# Patient Record
Sex: Female | Born: 1955 | Race: Black or African American | Hispanic: No | Marital: Married | Smoking: Never smoker
Health system: Southern US, Community
[De-identification: ages and names within clinical notes are randomized; demographics above are authoritative.]

## PROBLEM LIST (undated history)

## (undated) DIAGNOSIS — M810 Age-related osteoporosis without current pathological fracture: Secondary | ICD-10-CM

## (undated) DIAGNOSIS — L659 Nonscarring hair loss, unspecified: Secondary | ICD-10-CM

## (undated) DIAGNOSIS — C801 Malignant (primary) neoplasm, unspecified: Secondary | ICD-10-CM

## (undated) DIAGNOSIS — C50919 Malignant neoplasm of unspecified site of unspecified female breast: Secondary | ICD-10-CM

## (undated) DIAGNOSIS — M47814 Spondylosis without myelopathy or radiculopathy, thoracic region: Secondary | ICD-10-CM

## (undated) DIAGNOSIS — E785 Hyperlipidemia, unspecified: Secondary | ICD-10-CM

## (undated) DIAGNOSIS — L309 Dermatitis, unspecified: Secondary | ICD-10-CM

## (undated) DIAGNOSIS — T8859XA Other complications of anesthesia, initial encounter: Secondary | ICD-10-CM

## (undated) DIAGNOSIS — I10 Essential (primary) hypertension: Secondary | ICD-10-CM

## (undated) DIAGNOSIS — E669 Obesity, unspecified: Secondary | ICD-10-CM

## (undated) DIAGNOSIS — N649 Disorder of breast, unspecified: Secondary | ICD-10-CM

## (undated) DIAGNOSIS — T4145XA Adverse effect of unspecified anesthetic, initial encounter: Secondary | ICD-10-CM

## (undated) DIAGNOSIS — L409 Psoriasis, unspecified: Secondary | ICD-10-CM

## (undated) DIAGNOSIS — L219 Seborrheic dermatitis, unspecified: Secondary | ICD-10-CM

## (undated) DIAGNOSIS — Z853 Personal history of malignant neoplasm of breast: Secondary | ICD-10-CM

## (undated) DIAGNOSIS — K635 Polyp of colon: Secondary | ICD-10-CM

## (undated) HISTORY — DX: Polyp of colon: K63.5

## (undated) HISTORY — DX: Hyperlipidemia, unspecified: E78.5

## (undated) HISTORY — DX: Essential (primary) hypertension: I10

## (undated) HISTORY — DX: Malignant (primary) neoplasm, unspecified: C80.1

## (undated) HISTORY — DX: Obesity, unspecified: E66.9

## (undated) HISTORY — PX: TONSILLECTOMY: SUR1361

## (undated) HISTORY — PX: SHOULDER SURGERY: SHX246

## (undated) HISTORY — DX: Personal history of malignant neoplasm of breast: Z85.3

---

## 1996-12-02 HISTORY — PX: MASTECTOMY: SHX3

## 1998-01-27 ENCOUNTER — Ambulatory Visit (HOSPITAL_COMMUNITY): Admission: RE | Admit: 1998-01-27 | Discharge: 1998-01-27 | Payer: Self-pay | Admitting: Family Medicine

## 1998-04-21 ENCOUNTER — Encounter: Admission: RE | Admit: 1998-04-21 | Discharge: 1998-07-20 | Payer: Self-pay | Admitting: Family Medicine

## 1998-06-06 ENCOUNTER — Ambulatory Visit (HOSPITAL_BASED_OUTPATIENT_CLINIC_OR_DEPARTMENT_OTHER): Admission: RE | Admit: 1998-06-06 | Discharge: 1998-06-06 | Payer: Self-pay | Admitting: *Deleted

## 1998-08-08 ENCOUNTER — Emergency Department (HOSPITAL_COMMUNITY): Admission: EM | Admit: 1998-08-08 | Discharge: 1998-08-08 | Payer: Self-pay | Admitting: Emergency Medicine

## 1998-12-02 DIAGNOSIS — Z853 Personal history of malignant neoplasm of breast: Secondary | ICD-10-CM

## 1998-12-02 HISTORY — DX: Personal history of malignant neoplasm of breast: Z85.3

## 1999-03-22 ENCOUNTER — Ambulatory Visit (HOSPITAL_COMMUNITY): Admission: RE | Admit: 1999-03-22 | Discharge: 1999-03-22 | Payer: Self-pay | Admitting: Obstetrics and Gynecology

## 1999-03-22 ENCOUNTER — Encounter: Payer: Self-pay | Admitting: Obstetrics and Gynecology

## 2000-03-06 ENCOUNTER — Encounter: Payer: Self-pay | Admitting: Obstetrics and Gynecology

## 2000-03-06 ENCOUNTER — Ambulatory Visit (HOSPITAL_COMMUNITY): Admission: RE | Admit: 2000-03-06 | Discharge: 2000-03-06 | Payer: Self-pay | Admitting: Obstetrics and Gynecology

## 2000-04-29 ENCOUNTER — Other Ambulatory Visit: Admission: RE | Admit: 2000-04-29 | Discharge: 2000-04-29 | Payer: Self-pay | Admitting: Obstetrics and Gynecology

## 2001-03-09 ENCOUNTER — Ambulatory Visit (HOSPITAL_COMMUNITY): Admission: RE | Admit: 2001-03-09 | Discharge: 2001-03-09 | Payer: Self-pay | Admitting: Obstetrics and Gynecology

## 2001-03-09 ENCOUNTER — Encounter: Payer: Self-pay | Admitting: Obstetrics and Gynecology

## 2002-04-02 ENCOUNTER — Ambulatory Visit (HOSPITAL_COMMUNITY): Admission: RE | Admit: 2002-04-02 | Discharge: 2002-04-02 | Payer: Self-pay | Admitting: Obstetrics and Gynecology

## 2002-04-02 ENCOUNTER — Encounter: Payer: Self-pay | Admitting: Obstetrics and Gynecology

## 2003-04-13 ENCOUNTER — Other Ambulatory Visit: Admission: RE | Admit: 2003-04-13 | Discharge: 2003-04-13 | Payer: Self-pay | Admitting: Obstetrics and Gynecology

## 2003-04-14 ENCOUNTER — Ambulatory Visit (HOSPITAL_COMMUNITY): Admission: RE | Admit: 2003-04-14 | Discharge: 2003-04-14 | Payer: Self-pay | Admitting: Obstetrics and Gynecology

## 2003-04-14 ENCOUNTER — Encounter: Payer: Self-pay | Admitting: Obstetrics and Gynecology

## 2003-11-22 ENCOUNTER — Encounter: Admission: RE | Admit: 2003-11-22 | Discharge: 2004-02-20 | Payer: Self-pay | Admitting: Family Medicine

## 2004-02-14 ENCOUNTER — Ambulatory Visit (HOSPITAL_COMMUNITY): Admission: RE | Admit: 2004-02-14 | Discharge: 2004-02-14 | Payer: Self-pay | Admitting: Family Medicine

## 2004-10-22 ENCOUNTER — Ambulatory Visit (HOSPITAL_COMMUNITY): Admission: RE | Admit: 2004-10-22 | Discharge: 2004-10-22 | Payer: Self-pay | Admitting: Obstetrics and Gynecology

## 2005-10-23 ENCOUNTER — Ambulatory Visit (HOSPITAL_COMMUNITY): Admission: RE | Admit: 2005-10-23 | Discharge: 2005-10-23 | Payer: Self-pay | Admitting: Obstetrics and Gynecology

## 2005-11-07 ENCOUNTER — Ambulatory Visit (HOSPITAL_COMMUNITY): Admission: RE | Admit: 2005-11-07 | Discharge: 2005-11-07 | Payer: Self-pay | Admitting: Orthopedic Surgery

## 2006-06-25 ENCOUNTER — Emergency Department (HOSPITAL_COMMUNITY): Admission: EM | Admit: 2006-06-25 | Discharge: 2006-06-26 | Payer: Self-pay | Admitting: Emergency Medicine

## 2006-10-30 ENCOUNTER — Ambulatory Visit (HOSPITAL_COMMUNITY): Admission: RE | Admit: 2006-10-30 | Discharge: 2006-10-30 | Payer: Self-pay | Admitting: Obstetrics and Gynecology

## 2006-12-02 DIAGNOSIS — K635 Polyp of colon: Secondary | ICD-10-CM

## 2006-12-02 HISTORY — DX: Polyp of colon: K63.5

## 2006-12-02 HISTORY — PX: COLONOSCOPY W/ POLYPECTOMY: SHX1380

## 2007-11-06 ENCOUNTER — Ambulatory Visit (HOSPITAL_COMMUNITY): Admission: RE | Admit: 2007-11-06 | Discharge: 2007-11-06 | Payer: Self-pay | Admitting: Family Medicine

## 2007-11-06 LAB — HM MAMMOGRAPHY: HM Mammogram: NEGATIVE

## 2008-11-18 ENCOUNTER — Ambulatory Visit (HOSPITAL_COMMUNITY): Admission: RE | Admit: 2008-11-18 | Discharge: 2008-11-18 | Payer: Self-pay | Admitting: Obstetrics and Gynecology

## 2009-09-08 ENCOUNTER — Ambulatory Visit: Payer: Self-pay | Admitting: Family Medicine

## 2009-10-11 ENCOUNTER — Ambulatory Visit: Payer: Self-pay | Admitting: Family Medicine

## 2009-12-21 ENCOUNTER — Ambulatory Visit (HOSPITAL_COMMUNITY): Admission: RE | Admit: 2009-12-21 | Discharge: 2009-12-21 | Payer: Self-pay | Admitting: Obstetrics and Gynecology

## 2010-04-11 ENCOUNTER — Ambulatory Visit: Payer: Self-pay | Admitting: Family Medicine

## 2010-12-23 ENCOUNTER — Encounter: Payer: Self-pay | Admitting: Obstetrics and Gynecology

## 2011-02-26 ENCOUNTER — Other Ambulatory Visit (HOSPITAL_COMMUNITY): Payer: Self-pay | Admitting: Obstetrics and Gynecology

## 2011-02-26 DIAGNOSIS — Z1231 Encounter for screening mammogram for malignant neoplasm of breast: Secondary | ICD-10-CM

## 2011-03-06 ENCOUNTER — Ambulatory Visit (HOSPITAL_COMMUNITY)
Admission: RE | Admit: 2011-03-06 | Discharge: 2011-03-06 | Disposition: A | Discharge status: Home / Self Care | Payer: 59 | Source: Ambulatory Visit | Attending: Obstetrics and Gynecology | Admitting: Obstetrics and Gynecology

## 2011-03-06 DIAGNOSIS — Z1231 Encounter for screening mammogram for malignant neoplasm of breast: Secondary | ICD-10-CM | POA: Insufficient documentation

## 2011-05-07 ENCOUNTER — Telehealth: Payer: Self-pay | Admitting: Family Medicine

## 2011-05-07 NOTE — Telephone Encounter (Signed)
TOLD PT I WOULD CALL IN A 30 DAY NO MORE TO [facility] [address]

## 2011-05-07 NOTE — Telephone Encounter (Signed)
LEFT MESSAGE FOR PT TO CALL BACK.

## 2011-06-04 ENCOUNTER — Other Ambulatory Visit: Payer: Self-pay | Admitting: *Deleted

## 2011-06-04 ENCOUNTER — Encounter: Payer: Self-pay | Admitting: Medical

## 2011-06-04 ENCOUNTER — Ambulatory Visit (INDEPENDENT_AMBULATORY_CARE_PROVIDER_SITE_OTHER): Payer: 59 | Admitting: Medical

## 2011-06-04 DIAGNOSIS — R5383 Other fatigue: Secondary | ICD-10-CM

## 2011-06-04 DIAGNOSIS — I10 Essential (primary) hypertension: Secondary | ICD-10-CM | POA: Insufficient documentation

## 2011-06-04 DIAGNOSIS — R5381 Other malaise: Secondary | ICD-10-CM

## 2011-06-04 DIAGNOSIS — E785 Hyperlipidemia, unspecified: Secondary | ICD-10-CM

## 2011-06-04 DIAGNOSIS — E119 Type 2 diabetes mellitus without complications: Secondary | ICD-10-CM

## 2011-06-04 MED ORDER — METFORMIN HCL 1000 MG PO TABS: 1000.0000 mg | ORAL_TABLET | Freq: Two times a day (BID) | ORAL | Status: DC

## 2011-06-04 MED ORDER — SIMVASTATIN 20 MG PO TABS: 20.0000 mg | ORAL_TABLET | Freq: Every day | ORAL | Status: DC

## 2011-06-04 NOTE — Progress Notes (Signed)
Subjective:    Sharon Montgomery is an 55 y.o. female who presents for follow up of diabetes, cholesterol, blood pressure, in general recheck. Her last visit here was May of 2011.  She has been in her usual state of health. She has not been checking her glucoses as her glucometer quit working.  She notes that she tries to eat healthy, avoid sweets, does not drink soda or sweet tea. Her last eye exam was 7 months ago. She does check her feet daily. She gets her flu shot yearly through her employer which is [employer]. She has never had the pneumococcal vaccine. She needs her medications refilled. She denies any side effects from her diabetic or cholesterol medication. Her blood pressure has been controlled through diet and exercise.  The following portions of the patient's history were reviewed and updated as appropriate: allergies, current medications, past family history, past medical history, past social history, past surgical history and problem list.  Past Medical History  Diagnosis Date  . Diabetes mellitus   . Dyslipidemia   . Vitamin D deficiency   . Hypertension     controlled with diet  . History of breast cancer 2000    Review of Systems Constitutional: +fatigue; denies recent fevers, chills, weight changes, anorexia Allergy: denies recent allergy problems, nasal congestion, sneezing Skin: denies foot lesions, rash, or other worrisome lesion ENT: denies cough, runny nose, sore throat, hoarseness Cardiology: denies chest pain, palpitations, edema, orthopnea, paroxysmal nocturnal dyspnea Respiratory: denies shortness of breath, dyspnea on exertion, wheezing Gastroenterology: no abdominal pain, nausea, vomiting, diarrhea, constipation, bowel change, blood in stool Musculoskeletal: denies arthralgias, myalgias, cramps Opthalmology: denies vision change, blurred vision, allergy symptoms Urology: denies dysuria, frequency, urgency, blood in urine, stream changes Neurology: denies numbness,  tingling, weakness, gait changes Psychology: denies depressed mood, changes in stress    Objective:   Filed Vitals:   06/04/11 0930  BP: 120/80  Pulse: 71  Temp: 98 F (36.7 C)    General Appearance:    Alert, no distress, WD/WN, obese, black female   HEENT:    Normocephalic, without obvious abnormality, PERRLA, conjunctiva/corneas clear, EOM intact, fundi benign, TMs and external ears normal, nares patent, mucosa normal, pharynx normal appearing  Oral cavity:   Lips, mucosa, and tongue normal; teeth and gums normal  Neck:   Supple, no lymphadenopathy;  thyroid:  no    enlargement/tenderness/nodules; no carotid   bruit or JVD  Lungs:     Clear to auscultation bilaterally without wheezes, rales or       rhonchi; respirations unlabored   Heart:    Regular rate and rhythm, S1 and S2 normal, no murmur  Abdomen:     Soft, non-tender, non distended, normoactive bowel sounds,    no masses, no hepatosplenomegaly, no bruits  Extremities:   No clubbing, cyanosis or edema  Pulses:   2+ and symmetric all extremities  Skin:   Warm, dry, normal turgor, no foot lesions  Neurologic:   monofilament exam normal          Psych:   Normal mood, pleasant, normal affect, hygiene and grooming.     Assessment:   Encounter Diagnoses  Name Primary?  . Diabetes mellitus without mention of complication Yes  . Hyperlipidemia   . Essential hypertension, benign   . Fatigue       Plan:   Diabetes-hemoglobin A1c 8.6% today. We discussed possible complications of diabetes. Discussed the need to get this under control. We will increase her metformin  to 1000 twice a day, we will refer to diabetes education counseling, advise she work on her diet, exercise more, work on weight loss. Advise more frequent followup, recheck in 3 months. She is up-to-date on eye exam. She declines pneumococcal vaccine today though.  Hyperlipidemia-she will go for fasting labs.  Hypertension-currently controlled without  medication  Fatigue-increase exercise, she will go for labs.  She works at [employer], thus she will take a prescription for lab order to her employer.

## 2011-06-10 ENCOUNTER — Other Ambulatory Visit: Payer: Self-pay | Admitting: *Deleted

## 2011-06-10 DIAGNOSIS — E7800 Pure hypercholesterolemia, unspecified: Secondary | ICD-10-CM

## 2011-06-10 MED ORDER — SIMVASTATIN 20 MG PO TABS: 40.0000 mg | ORAL_TABLET | Freq: Every day | ORAL | Status: DC

## 2011-06-10 NOTE — Telephone Encounter (Addendum)
Message copied by Dorthula Perfect on Mon Jun 10, 2011  4:54 PM ------      Message from: Jac Canavan      Created: Mon Jun 10, 2011  1:58 PM       Her labs show that her LDL bad chol is not at goal.  Needs to be close to 80, but her's is 136.  Otherwise her kidney, liver, blood counts, urine, thyroid, and lytes all normal.  Lets increase her Simvastatin 20mg  to 40mg .  So call out Simvastatin 40mg  1 tablet daily QHS #90, 3 refills and update order electronically in chart.  She should already be on the higher dose of Metformin as we discussed.  C/t with plan for dietician referral.  Recheck 6mo.   Pt informed of all lab results.  Sent rx Simvastatin 40 mg 1 po QHS #90 with 3 refills to [facility].   Pt will call back to schedule 3 month visit.  CM, LPN

## 2011-08-12 ENCOUNTER — Telehealth: Payer: Self-pay | Admitting: Medical

## 2011-08-13 ENCOUNTER — Other Ambulatory Visit: Payer: Self-pay | Admitting: Medical

## 2011-08-13 ENCOUNTER — Telehealth: Payer: Self-pay | Admitting: *Deleted

## 2011-08-13 MED ORDER — SIMVASTATIN 40 MG PO TABS: 40.0000 mg | ORAL_TABLET | Freq: Every evening | ORAL | Status: DC

## 2011-08-13 NOTE — Telephone Encounter (Addendum)
Message copied by Dorthula Perfect on Tue Aug 13, 2011  1:45 PM ------      Message from: Jac Canavan      Created: Tue Aug 13, 2011  8:45 AM       I saw her in early July.  I can't see where we called her with lab results although I am certain we did.  Anyway, her cholesterol was too high, and her diabetes marker was too high.  We made some changes at that time, and I receive refill request yesterday on Simvastatin for cholesterol.  I will refill it at 40mg  QHS.  I want to see her back in early October for recheck.    Pt was informed of prescription being sent to pharmacy, simvastatin 40 mg 1 po QHS #90 with 3 refills.  Pt was also informed to call and schedule an appointment for early October for a recheck.  CM, LPN

## 2011-08-14 NOTE — Telephone Encounter (Signed)
DONE

## 2011-10-03 ENCOUNTER — Other Ambulatory Visit: Payer: Self-pay | Admitting: Medical

## 2011-10-03 ENCOUNTER — Telehealth: Payer: Self-pay | Admitting: Medical

## 2011-10-03 DIAGNOSIS — IMO0001 Reserved for inherently not codable concepts without codable children: Secondary | ICD-10-CM

## 2011-10-03 DIAGNOSIS — Z79899 Other long term (current) drug therapy: Secondary | ICD-10-CM

## 2011-10-03 DIAGNOSIS — E785 Hyperlipidemia, unspecified: Secondary | ICD-10-CM

## 2011-10-03 NOTE — Telephone Encounter (Signed)
i looked over her chart.  I put a handwritten script and put electronic orders in for the labs that i would plan to do - hemoglobin A1C marker, lipid panel and ALT liver test.  We can get her the script for labs at [facility] she can do fasting ahead of time.  Script order on chart.

## 2011-10-03 NOTE — Telephone Encounter (Signed)
Please call pt has follow up appt next week. She wants to know if you will be doing blood work, if so she wants to have it done before appointment  Please call patient

## 2011-10-04 NOTE — Telephone Encounter (Signed)
Spoke to patient, told her we have order for labs in her chart. She will call back with fax # to send it to

## 2011-10-07 ENCOUNTER — Encounter: Payer: Self-pay | Admitting: Family Medicine

## 2011-10-09 ENCOUNTER — Ambulatory Visit (INDEPENDENT_AMBULATORY_CARE_PROVIDER_SITE_OTHER): Payer: 59 | Admitting: Medical

## 2011-10-09 ENCOUNTER — Encounter: Payer: Self-pay | Admitting: Medical

## 2011-10-09 VITALS — BP 122/80 | HR 72 | Temp 98.1°F | Resp 18 | Wt 209.0 lb

## 2011-10-09 DIAGNOSIS — I10 Essential (primary) hypertension: Secondary | ICD-10-CM

## 2011-10-09 DIAGNOSIS — IMO0002 Reserved for concepts with insufficient information to code with codable children: Secondary | ICD-10-CM | POA: Insufficient documentation

## 2011-10-09 DIAGNOSIS — E785 Hyperlipidemia, unspecified: Secondary | ICD-10-CM

## 2011-10-09 DIAGNOSIS — E1165 Type 2 diabetes mellitus with hyperglycemia: Secondary | ICD-10-CM | POA: Insufficient documentation

## 2011-10-09 DIAGNOSIS — IMO0001 Reserved for inherently not codable concepts without codable children: Secondary | ICD-10-CM

## 2011-10-09 DIAGNOSIS — E669 Obesity, unspecified: Secondary | ICD-10-CM

## 2011-10-09 MED ORDER — SAXAGLIPTIN HCL 5 MG PO TABS: 5.0000 mg | ORAL_TABLET | Freq: Every day | ORAL | Status: DC

## 2011-10-09 NOTE — Progress Notes (Signed)
Subjective:   HPI  Sharon Montgomery is a 55 y.o. female who presents for general followup.  I saw her last in July for routine chronic disease followup.  At that time she apparently was taking metformin 500 mg 2 tablets twice a day, although I was under the impression she was taken one tablet twice a day. Since then she has been taking 1000 mg tablets twice a day which is the same.  She came by last week for prescription to take to her employer [facility] to have labs done, and we have those today to review.  Since last visit she has joined [employer], and has lost down from 221 pounds to 209 pounds today.  She has made a lot of diet changes, is exercising 20 minutes per day 5 days a week on a treadmill.  Her blood pressure runs normal outside of our office, and she is compliant with her blood pressure medication.  She is compliant with her other medications for cholesterol and diabetes as well.  She does not check her glucose every day, but lately has been 140-160.  In the past, the only other medications she has taken for diabetes and cholesterol was Avandia and Actos, and no other cholesterol medication.  No other c/o.  The following portions of the patient's history were reviewed and updated as appropriate: allergies, current medications, past family history, past medical history, past social history, past surgical history and problem list.  Past Medical History  Diagnosis Date  . Dyslipidemia   . Vitamin D deficiency   . Hypertension     controlled with diet  . History of breast cancer 2000  . Diabetes mellitus     TYPE 2  . Cancer     BREAST   Review of Systems Constitutional: -fever, -chills, -sweats, -unexpected -weight change,-fatigue ENT: -runny nose, -ear pain, -sore throat Cardiology:  -chest pain, -palpitations, -edema Respiratory: -cough, -shortness of breath, -wheezing Gastroenterology: -abdominal pain, -nausea, -vomiting, -diarrhea, -constipation Hematology: -bleeding or  bruising problems Musculoskeletal: -arthralgias, -myalgias, -joint swelling, -back pain Ophthalmology: -vision changes Urology: -dysuria, -difficulty urinating, -hematuria, -urinary frequency, -urgency Neurology: -headache, -weakness, -tingling, -numbness     Objective:   Physical Exam  Filed Vitals:   10/09/11 1138  BP: 122/80  Pulse: 72  Temp: 98.1 F (36.7 C)  Resp: 18    General appearance: alert, no distress, WD/WN, black female Oral cavity: MMM, no lesions Heart: RRR, normal S1, S2, no murmurs Lungs: CTA bilaterally, no wheezes, rhonchi, or rales Ext: no edema Pulses: 2+ symmetric, upper and lower extremities, normal cap refill   Assessment and Plan :    Encounter Diagnoses  Name Primary?  . Type II or unspecified type diabetes mellitus without mention of complication, uncontrolled Yes  . Essential hypertension, benign   . Dyslipidemia   . Obesity    Diabetes type 2-due to miscommunication, she had been on the same diabetes medication for the last 3 months which is metformin 1000 mg twice a day.  Her hemoglobin A1c is 8.4% unchanged really from prior.  I had Onglyza 5 mg daily today.  She will continue daily exercise, continue with diet control and Weight Watchers and recheck in 3-4 months.  Hypertension-controlled on current medication  Dyslipidemia-recent LDL is 187, definitely not at goal.  She just got her simvastatin 40 mg refilled and does not want to change anything at this time.  I recommend we change to a different statin. Instead she'll continue her efforts at weight  loss which is improving, and recheck in 3-4 months.  Obesity-she has made significant improvements in her weight loss efforts in the past 3 months, and she seems motivated to continue doing this. We talked more about diet and other strategies today. She will continue Weight Watchers program, continue daily exercise and continue efforts to lose weight.   Follow-up 3-4 mo.  Of note she is  up-to-date on flu and pneumococcal vaccines, gets these through work.

## 2011-10-31 ENCOUNTER — Telehealth: Payer: Self-pay | Admitting: Medical

## 2011-10-31 DIAGNOSIS — IMO0001 Reserved for inherently not codable concepts without codable children: Secondary | ICD-10-CM

## 2011-10-31 MED ORDER — METFORMIN HCL 1000 MG PO TABS: 1000.0000 mg | ORAL_TABLET | Freq: Two times a day (BID) | ORAL | Status: DC

## 2011-10-31 NOTE — Telephone Encounter (Signed)
I SENT RX REFILL TO THE PHARMACY. CLS

## 2011-11-01 ENCOUNTER — Other Ambulatory Visit: Payer: Self-pay | Admitting: Medical

## 2011-11-01 MED ORDER — SAXAGLIPTIN HCL 5 MG PO TABS: 5.0000 mg | ORAL_TABLET | Freq: Every day | ORAL | Status: DC

## 2012-03-20 ENCOUNTER — Encounter: Payer: Self-pay | Admitting: Obstetrics and Gynecology

## 2012-03-20 NOTE — Progress Notes (Signed)
Evaluation  For Left breast prosthesis and mastectomy bra has been reviewed, signed, and faxed to [facility] per Medicare requirements.

## 2012-03-31 ENCOUNTER — Telehealth: Payer: Self-pay | Admitting: Obstetrics and Gynecology

## 2012-03-31 NOTE — Telephone Encounter (Signed)
Routed to triage 

## 2012-04-01 ENCOUNTER — Telehealth: Payer: Self-pay | Admitting: Obstetrics and Gynecology

## 2012-04-01 NOTE — Telephone Encounter (Signed)
Spoke with pt rgd msg. Pt stated that she wanted  her blood type. Told pt that she is a +. Pt's voice understanding. bt cma

## 2012-05-12 ENCOUNTER — Telehealth: Payer: Self-pay | Admitting: Medical

## 2012-05-12 NOTE — Telephone Encounter (Signed)
Pt notified that she needs Ov. will call back and schedule an OV. Does not need a 30 day supply right now she still has some pills left.

## 2012-05-12 NOTE — Telephone Encounter (Signed)
Needs OV, 90 day refill not sent, but if need can send 30 day so she doesn't run out.  She could even come today as we have availability today or this wk.

## 2012-08-07 ENCOUNTER — Telehealth: Payer: Self-pay | Admitting: Medical

## 2012-08-07 DIAGNOSIS — IMO0001 Reserved for inherently not codable concepts without codable children: Secondary | ICD-10-CM

## 2012-08-07 MED ORDER — METFORMIN HCL 1000 MG PO TABS: 1000.0000 mg | ORAL_TABLET | Freq: Two times a day (BID) | ORAL | Status: DC

## 2012-08-07 NOTE — Telephone Encounter (Signed)
Sent med in 

## 2012-08-14 ENCOUNTER — Encounter: Payer: Self-pay | Admitting: Medical

## 2012-08-14 ENCOUNTER — Ambulatory Visit: Payer: 59 | Admitting: Medical

## 2012-08-14 VITALS — BP 120/80 | HR 72 | Temp 98.0°F | Resp 16 | Wt 242.0 lb

## 2012-08-17 ENCOUNTER — Encounter: Payer: Self-pay | Admitting: Family Medicine

## 2012-08-17 ENCOUNTER — Ambulatory Visit (INDEPENDENT_AMBULATORY_CARE_PROVIDER_SITE_OTHER): Payer: 59 | Admitting: Family Medicine

## 2012-08-17 VITALS — BP 130/70 | HR 76 | Temp 98.0°F | Resp 16 | Wt 221.0 lb

## 2012-08-17 DIAGNOSIS — E669 Obesity, unspecified: Secondary | ICD-10-CM

## 2012-08-17 DIAGNOSIS — I10 Essential (primary) hypertension: Secondary | ICD-10-CM

## 2012-08-17 DIAGNOSIS — E785 Hyperlipidemia, unspecified: Secondary | ICD-10-CM

## 2012-08-17 DIAGNOSIS — IMO0001 Reserved for inherently not codable concepts without codable children: Secondary | ICD-10-CM

## 2012-08-17 MED ORDER — SAXAGLIPTIN-METFORMIN ER 2.5-1000 MG PO TB24: 2.0000 | ORAL_TABLET | Freq: Every day | ORAL | Status: DC

## 2012-08-17 MED ORDER — LIPITOR 40 MG PO TABS: 40.0000 mg | ORAL_TABLET | Freq: Every day | ORAL | Status: DC

## 2012-08-17 NOTE — Progress Notes (Signed)
Chief Complaint  Patient presents with  . medication check    Pt. has a copy of her recent lab work   HPI:  Diabetes follow-up:  Blood sugars are not being checked at home, as her machine broke.  Denies hypoglycemia.  Some  polydipsia and polyuria later in the day.  Last eye exam was 6 months ago, per patient.  Patient follows a low sugar diet and checks feet regularly without concerns.   Hyperlipidemia follow-up:  Patient is reportedly following a low-fat, low cholesterol diet.  Compliant with medications and denies medication side effects.  She has a h/o HTN, but has been off meds.  She lost a lot of weight, be regained it.  Recalls having a cough from lisinopril in past.  She brings in copies of labs done at [facility]:  A1c 7.7 Glucose 229 Total cholesterol 239; HDL 89, chol/HDL 2.7, LDL121, TG 147   Past Medical History  Diagnosis Date  . Dyslipidemia   . Vitamin d deficiency   . History of breast cancer 2000  . Diabetes mellitus     TYPE 2  . Cancer     BREAST  . Hypertension     controlled with diet  . Colon polyp 2008    adenomatous, colonoscopy every 5 years   Past Surgical History  Procedure Date  . Colonoscopy 2008   History   Social History  . Marital Status: Married    Spouse Name: N/A    Number of Children: N/A  . Years of Education: N/A   Occupational History  . Not on file.   Social History Main Topics  . Smoking status: Never Smoker   . Smokeless tobacco: Never Used  . Alcohol Use: No  . Drug Use: No  . Sexually Active: Not on file     works in office at [employer], exercises 2 days/wk   Other Topics Concern  . Not on file   Social History Narrative  . No narrative on file   Current Outpatient Prescriptions on File Prior to Visit  Medication Sig Dispense Refill  . aspirin 81 MG tablet Take 81 mg by mouth daily.        . Loratadine-Pseudoephedrine (CLARITIN-D 12 HOUR PO) Take 1 tablet by mouth daily.        . metFORMIN (GLUCOPHAGE) 1000 MG  tablet Take 1 tablet (1,000 mg total) by mouth 2 (two) times daily with a meal.  60 tablet  0  . simvastatin (ZOCOR) 40 MG tablet Take 1 tablet (40 mg total) by mouth every evening.  90 tablet  3   Allergies  Allergen Reactions  . Lisinopril Cough   ROS:  Denies fevers, headaches, dizziness, chest pain, palpitations, URI symptoms, cough, shortness of breath, skin rashes/lesions, numbness/tingling, GI complaints, edema, or other concerns  PHYSICAL EXAM: BP 130/70  Pulse 76  Temp 98 F (36.7 C) (Oral)  Resp 16  Wt 221 lb (100.245 kg) Well developed, pleasant, obese female in no distress Neck: no lymphadenopathy, thyromegaly or mass Heart: regular rate and rhythm without murmur Lungs: clear bilaterally Abdomen: soft, nontender, no organomegaly or mass Extremities: no edema, normal pulses Skin: no rash Psych: normal mood, affect, hygiene and grooming Neuro: cranial nerves grossly intact. Normal gait.  Alert and oriented  Labs--as above in HPI.  ASSESSMENT/PLAN: 1. Type II or unspecified type diabetes mellitus without mention of complication, uncontrolled  Saxagliptin-Metformin 2.04-999 MG TB24  2. Hyperlipidemia  LIPITOR 40 MG tablet  3. Essential hypertension, benign  controlled by diet/behavior, has been off meds.  Prev had cough from ACEI  4. Obesity       Urine microalbumin/Cr ratio to be done through [facility]--rx written and given to pt.  If elevated, start ARB--she recalls cough from ACEI in past.  DM--improved from November, but still suboptimally controlled. Has never been on other diabetes meds other than metformin. Take 2 kombiglyze each morning. If having some side effects (especially GI) can split to taking 1 tablet twice daily.  Call for prescription if tolerating.  Given copay card.  Hyperlipidemia--still not at goal. Change from simvastatin 40 to atorvastatin 40mg .  Call if not tolerating due to side effects.  Goal LDL<100. Given Lipitor samples and $4 copay  card.  Check with insurance, and if it honors this card, make sure to tell us when calling for prescription that it must be the brand  Patient to get flu shot through work. Recalls having received pneumovax at [facility]--will check on date and let us know.  Thinks it was 3-4 years ago. She will also check on her tetanus vaccine.  rx given for urine (needs to be done through [facility]) rx given for c-met, lipids, A1c, TSH and Vitamin D-OH to be done prior to visit in 3 months.  Bring results to visit.

## 2012-08-17 NOTE — Patient Instructions (Addendum)
Take 2 kombiglyze each morning. If having some side effects (especially GI) can split to taking 1 tablet twice daily.  Call for prescription if tolerating.  Given copay card.  Change from simvastatin 40 to atorvastatin 40mg .  Call if not tolerating due to side effects.  Goal LDL<100. Given Lipitor samples and $4 copay card.  Check with insurance, and if it honors this card, make sure to tell us when calling for prescription that it must be the brand  Don't forget to get flu shot at work.  Call us with dates for pneumonia and tetanus shots so we can enter into our system/medical record.

## 2012-08-20 ENCOUNTER — Other Ambulatory Visit: Payer: Self-pay | Admitting: Obstetrics and Gynecology

## 2012-08-20 DIAGNOSIS — Z1231 Encounter for screening mammogram for malignant neoplasm of breast: Secondary | ICD-10-CM

## 2012-08-21 ENCOUNTER — Encounter: Payer: Self-pay | Admitting: Family Medicine

## 2012-08-24 ENCOUNTER — Telehealth: Payer: Self-pay | Admitting: Obstetrics and Gynecology

## 2012-08-24 ENCOUNTER — Telehealth: Payer: Self-pay | Admitting: *Deleted

## 2012-08-24 NOTE — Telephone Encounter (Signed)
Advise pt--microalbumin/Cr ratio normal, no meds needed

## 2012-08-24 NOTE — Telephone Encounter (Signed)
Triage/appt req. °

## 2012-08-24 NOTE — Telephone Encounter (Signed)
Pt informed and verbalized understanding

## 2012-08-24 NOTE — Telephone Encounter (Signed)
Tc to pt regarding msg.  Pt states that she has been having some vaginal burning/stinging for the past month.  Pt has a hx of diabetes.  Pt denies fever, abnl d/c or bldg.  Pt sched an appt for Tuesday 09/01/12 @ 0845 w/ EP. Pt will call daily to see if any cancellations to be worked in sooner.

## 2012-08-24 NOTE — Telephone Encounter (Signed)
Patient called to see if her microalbumin urine results were received from [facility]. I did see where they were scanned in. Office note from 9/16 states that if results were increased then she was to start ARB. Results were normal. She called to make sure that she was not supposed to start any new medications. I told her that I did not think she needed to per office note but I did want to double check with you.

## 2012-08-26 ENCOUNTER — Other Ambulatory Visit: Payer: Self-pay | Admitting: Obstetrics and Gynecology

## 2012-08-26 ENCOUNTER — Telehealth: Payer: Self-pay | Admitting: Obstetrics and Gynecology

## 2012-08-26 DIAGNOSIS — R300 Dysuria: Secondary | ICD-10-CM

## 2012-08-26 NOTE — Telephone Encounter (Signed)
Pt called, requesting an order for a UA be done @ her job, pt called earlier c/o vaginal stinging/burning.  Scheduled pt appt in the office on 09/01/12 @ 0845 w/ EP for eval.  Faxed order for UA to [facility] [phone]).  Pt to call with any other concerns/questions.

## 2012-08-26 NOTE — Progress Notes (Signed)
error 

## 2012-08-28 ENCOUNTER — Telehealth: Payer: Self-pay | Admitting: Family Medicine

## 2012-08-28 DIAGNOSIS — E785 Hyperlipidemia, unspecified: Secondary | ICD-10-CM

## 2012-08-28 DIAGNOSIS — IMO0001 Reserved for inherently not codable concepts without codable children: Secondary | ICD-10-CM

## 2012-08-28 MED ORDER — SAXAGLIPTIN-METFORMIN ER 2.5-1000 MG PO TB24: 2.0000 | ORAL_TABLET | Freq: Every day | ORAL | Status: DC

## 2012-08-28 MED ORDER — LIPITOR 40 MG PO TABS: 40.0000 mg | ORAL_TABLET | Freq: Every day | ORAL | Status: DC

## 2012-08-28 NOTE — Telephone Encounter (Signed)
[facility].  Please refill test strips.  (sent for brand Lipitor, as she was given $4 copay card--if that doesn't work, then can change to generic; I'm sure pt will let us know)

## 2012-08-31 ENCOUNTER — Other Ambulatory Visit: Payer: Self-pay | Admitting: *Deleted

## 2012-08-31 DIAGNOSIS — IMO0001 Reserved for inherently not codable concepts without codable children: Secondary | ICD-10-CM

## 2012-08-31 DIAGNOSIS — E785 Hyperlipidemia, unspecified: Secondary | ICD-10-CM

## 2012-08-31 LAB — URINALYSIS
Ketones, UA: NEGATIVE
Nitrite, UA: NEGATIVE
RBC, UA: NEGATIVE
Specific Gravity, UA: >=1.03 — AB (ref 1.005–1.030)
Urobilinogen, Ur: 0.2 mg/dL (ref 0.0–1.9)
pH, UA: 6 (ref 5.0–7.5)

## 2012-08-31 MED ORDER — GLUCOSE BLOOD VI STRP: ORAL_STRIP | Status: DC

## 2012-08-31 MED ORDER — LIPITOR 40 MG PO TABS: 40.0000 mg | ORAL_TABLET | Freq: Every day | ORAL | Status: DC

## 2012-08-31 MED ORDER — SAXAGLIPTIN-METFORMIN ER 2.5-1000 MG PO TB24: 2.0000 | ORAL_TABLET | Freq: Every day | ORAL | Status: DC

## 2012-08-31 NOTE — Telephone Encounter (Signed)
Spoke with patient and I phoned in rx to [facility] for her test strips. Her pharmacy is not [facility], it is now [facility].

## 2012-09-01 ENCOUNTER — Encounter: Payer: Self-pay | Admitting: Obstetrics and Gynecology

## 2012-09-01 ENCOUNTER — Ambulatory Visit (INDEPENDENT_AMBULATORY_CARE_PROVIDER_SITE_OTHER): Payer: 59 | Admitting: Obstetrics and Gynecology

## 2012-09-01 VITALS — BP 120/72 | Temp 97.9°F | Wt 224.0 lb

## 2012-09-01 DIAGNOSIS — B373 Candidiasis of vulva and vagina: Secondary | ICD-10-CM

## 2012-09-01 LAB — POCT WET PREP (WET MOUNT): pH: 4.5

## 2012-09-01 MED ORDER — NYSTATIN-TRIAMCINOLONE 100000-0.1 UNIT/GM-% EX OINT: TOPICAL_OINTMENT | Freq: Two times a day (BID) | CUTANEOUS | Status: DC

## 2012-09-01 MED ORDER — TERCONAZOLE 0.4 % VA CREA: 1.0000 | TOPICAL_CREAM | Freq: Every day | VAGINAL | Status: DC

## 2012-09-01 NOTE — Patient Instructions (Addendum)
Monilial Vaginitis Vaginitis in a soreness, swelling and redness (inflammation) of the vagina and vulva. Monilial vaginitis is not a sexually transmitted infection. CAUSES  Yeast vaginitis is caused by yeast (candida) that is normally found in your vagina. With a yeast infection, the candida has overgrown in number to a point that upsets the chemical balance. SYMPTOMS   White, thick vaginal discharge.   Swelling, itching, redness and irritation of the vagina and possibly the lips of the vagina (vulva).   Burning or painful urination.   Painful intercourse.  DIAGNOSIS  Things that may contribute to monilial vaginitis are:  Postmenopausal and virginal states.   Pregnancy.   Infections.   Being tired, sick or stressed, especially if you had monilial vaginitis in the past.   Diabetes. Good control will help lower the chance.   Birth control pills.   Avoid: - excess soap on genital area (consider using plain oatmeal soap) - use of powder or sprays in genital area - douching - wearing underwear to bed (except with menses) - using more than is directed detergent when washing clothes - tight fitting garments around genital area - excess sugar intake    Tight fitting garments.   Using bubble bath, feminine sprays, douches or deodorant tampons.   Taking certain medications that kill germs (antibiotics).   Sporadic recurrence can occur if you become ill.  TREATMENT  Your caregiver will give you medication.  There are several kinds of anti monilial vaginal creams and suppositories specific for monilial vaginitis. For recurrent yeast infections, use a suppository or cream in the vagina 2 times a week, or as directed.   Anti-monilial or steroid cream for the itching or irritation of the vulva may also be used. Get your caregiver's permission.   Painting the vagina with methylene blue solution may help if the monilial cream does not work.   Eating yogurt may help prevent  monilial vaginitis.  HOME CARE INSTRUCTIONS   Finish all medication as prescribed.   Do not have sex until treatment is completed or after your caregiver tells you it is okay.   Take warm sitz baths.   Do not douche.   Do not use tampons, especially scented ones.   Wear cotton underwear.   Avoid tight pants and panty hose.   Tell your sexual partner that you have a yeast infection. They should go to their caregiver if they have symptoms such as mild rash or itching.   Your sexual partner should be treated as well if your infection is difficult to eliminate.   Practice safer sex. Use condoms.   Some vaginal medications cause latex condoms to fail. Vaginal medications that harm condoms are:   Cleocin cream.   Butoconazole (Femstat).   Terconazole (Terazol) vaginal suppository.   Miconazole (Monistat) (may be purchased over the counter).  SEEK MEDICAL CARE IF:   You have a temperature by mouth above 102 F (38.9 C).   The infection is getting worse after 2 days of treatment.   The infection is not getting better after 3 days of treatment.   You develop blisters in or around your vagina.   You develop vaginal bleeding, and it is not your menstrual period.   You have pain when you urinate.   You develop intestinal problems.   You have pain with sexual intercourse.  Document Released: 08/28/2005 Document Revised: 11/07/2011 Document Reviewed: 05/12/2009 [facility] Patient Information 2012 [place], [place].  Jock Itch Jock itch is a fungal infection of the skin  in the groin area. It is sometimes called "ringworm" even though it is not caused by a worm. A fungus is a type of germ that thrives in dark, damp places.  CAUSES  This infection may spread from:  A fungus infection elsewhere on the body (such as athlete's foot).   Sharing towels or clothing.  This infection is more common in:  Hot, humid climates.   People who wear tight-fitting clothing or wet  bathing suits for long periods of time.   Athletes.   Overweight people.   People with diabetes.  SYMPTOMS  Jock itch causes the following symptoms:  Red, pink or brown rash in the groin. Rash may spread to the thighs, anus, and buttocks.   Itching.  DIAGNOSIS  Your caregiver may make the diagnosis by looking at the rash. Sometimes a skin scraping will be sent to test for fungus. Testing can be done either by looking under the microscope or by doing a culture (test to try to grow the fungus). A culture can take up to 2 weeks to come back. TREATMENT  Jock itch may be treated with:  Skin cream or ointment to kill fungus.   Medicine by mouth to kill fungus.   Skin cream or ointment to calm the itching.   Compresses or medicated powders to dry the infected skin.  HOME CARE INSTRUCTIONS   Be sure to treat the rash completely. Follow your caregiver's instructions. It can take a couple of weeks to treat. If you do not treat the infection long enough, the rash can come back.   Wear loose-fitting clothing.   Men should wear cotton boxer shorts.   Women should wear cotton underwear.   Avoid hot baths.   Dry the groin area well after bathing.  SEEK MEDICAL CARE IF:   Your rash is worse.   Your rash is spreading.   Your rash returns after treatment is finished.   Your rash is not gone in 4 weeks. Fungal infections are slow to respond to treatment. Some redness may remain for several weeks after the fungus is gone.  SEEK IMMEDIATE MEDICAL CARE IF:  The area becomes red, warm, tender, and swollen.   You have a fever.  Document Released: 11/08/2002 Document Revised: 11/07/2011 Document Reviewed: 10/07/2008 [facility] Patient Information 2012 [place], [place].

## 2012-09-01 NOTE — Progress Notes (Signed)
56YO  complains of burning/irritation x 3 weeks.  Has noticed a discharge and admits to a new a new cholesterol medication but no antibiotics.  Additionally brought in lab results on urinalysis and microalbumin/creatinine ratio.  The U/A showed large glucosuria and microalbumin/creatinine ratio were normal.   O: Pelvic: EGBUS-well circumscribed pigmented macular rash that is mildly flaky, (patient has moderate powder on perineal area, vagina-moderate white discharge, cervix/adnexae/uterus-normal   Wet Prep: pH-4.5, whiff-negative,  large yeast  A: Candida Vulvovaginitis     Uncontrolled Diabetes  P: Perineal Hygiene

## 2012-09-02 ENCOUNTER — Ambulatory Visit: Payer: Self-pay | Admitting: Obstetrics and Gynecology

## 2012-09-04 ENCOUNTER — Ambulatory Visit (HOSPITAL_COMMUNITY)
Admission: RE | Admit: 2012-09-04 | Discharge: 2012-09-04 | Disposition: A | Discharge status: Home / Self Care | Payer: 59 | Source: Ambulatory Visit | Attending: Obstetrics and Gynecology | Admitting: Obstetrics and Gynecology

## 2012-09-04 DIAGNOSIS — Z1231 Encounter for screening mammogram for malignant neoplasm of breast: Secondary | ICD-10-CM

## 2012-09-11 ENCOUNTER — Other Ambulatory Visit: Payer: Self-pay | Admitting: Internal Medicine

## 2012-09-11 DIAGNOSIS — E785 Hyperlipidemia, unspecified: Secondary | ICD-10-CM

## 2012-09-11 DIAGNOSIS — IMO0001 Reserved for inherently not codable concepts without codable children: Secondary | ICD-10-CM

## 2012-09-11 MED ORDER — SAXAGLIPTIN-METFORMIN ER 2.5-1000 MG PO TB24: 2.0000 | ORAL_TABLET | Freq: Every day | ORAL | Status: DC

## 2012-09-11 MED ORDER — LIPITOR 40 MG PO TABS: 40.0000 mg | ORAL_TABLET | Freq: Every day | ORAL | Status: DC

## 2012-09-11 NOTE — Telephone Encounter (Signed)
Pt called stating that her lipitor and klomglizide was over 400 dollars and sent it back and called around and was able to find a pharmacy in [place] that was cheaper and so i have sent both prescriptions over to [facility] pharmacy

## 2012-09-24 ENCOUNTER — Telehealth: Payer: Self-pay | Admitting: Family Medicine

## 2012-09-24 DIAGNOSIS — IMO0001 Reserved for inherently not codable concepts without codable children: Secondary | ICD-10-CM

## 2012-09-24 MED ORDER — SAXAGLIPTIN-METFORMIN ER 2.5-1000 MG PO TB24: 2.0000 | ORAL_TABLET | Freq: Every day | ORAL | Status: DC

## 2012-09-24 NOTE — Telephone Encounter (Signed)
Med sent in for 15 days

## 2012-09-29 ENCOUNTER — Telehealth: Payer: Self-pay | Admitting: Family Medicine

## 2012-09-29 DIAGNOSIS — IMO0001 Reserved for inherently not codable concepts without codable children: Secondary | ICD-10-CM

## 2012-09-29 NOTE — Telephone Encounter (Signed)
Pt called wants to go back to Metformin, KombiglyZe too expensive  Sent to [facility]

## 2012-09-30 MED ORDER — SAXAGLIPTIN-METFORMIN ER 2.5-1000 MG PO TB24: 2.0000 | ORAL_TABLET | Freq: Every day | ORAL | Status: DC

## 2012-09-30 NOTE — Telephone Encounter (Signed)
Pt states that her insurance would not let her use the copay card. Pt states that [facility] was charging $90 for a 30 day supply for kombigylze and pt states at a local pharmacy she could get 15 pills for $10.00. So if  Want me to, i can send kombiglyze 2.5-1000mg  to [employer] [employer] [facility] that way she will continue on that medicine. However she has an appt 12/9 for a med check.

## 2012-09-30 NOTE — Telephone Encounter (Signed)
The question is whether or not she would be allowed to get Kombiglyze monthly from a local pharmacy (and use the copay card) or if her insurance requires her to use mail order for all longterm meds.  For now, call in 30 day supply with 2 refills to local pharmacy, as it appears the copay card will work there, and she can then look into whether she will only be allowed to do that for three months, or longterm.

## 2012-09-30 NOTE — Telephone Encounter (Signed)
Sent in med. Has tried to call pt twice and left message both time to return my call

## 2012-09-30 NOTE — Telephone Encounter (Signed)
Did she use the copay card?? That should make the medicine affordable.  The metformin alone was NOT controlling her diabetes, as her last A1c was 7.7.  If she is unable to afford the kombiglyze, even with the copay card, we have a few choices: 1: see if similar meds are less expensive (Janumet, Jentadueto, etc--if these are preferred over Kombiglyze with her insurance, and we also have copay cards for these), 2: add glipizide instead (but I don't like this as an option--it increases the risk for low blood sugars and can cause weight gain), 3: give her a 3 month trial back on metformin alone, but she has to work very hard on diet, daily exercise, weight loss and monitoring her sugars regularly, then return in 3 months, and if A1c is still >7 then will need to choose one of the other options.

## 2012-10-06 ENCOUNTER — Telehealth: Payer: Self-pay | Admitting: Internal Medicine

## 2012-10-06 DIAGNOSIS — IMO0001 Reserved for inherently not codable concepts without codable children: Secondary | ICD-10-CM

## 2012-10-06 MED ORDER — SAXAGLIPTIN-METFORMIN ER 2.5-1000 MG PO TB24: 2.0000 | ORAL_TABLET | Freq: Every day | ORAL | Status: DC

## 2012-10-06 NOTE — Telephone Encounter (Signed)
Pt called and i only sent kombiglyze in for half a month and it needed to be #60

## 2012-10-07 ENCOUNTER — Telehealth: Payer: Self-pay | Admitting: Obstetrics and Gynecology

## 2012-10-08 ENCOUNTER — Encounter: Payer: Self-pay | Admitting: Obstetrics and Gynecology

## 2012-10-08 ENCOUNTER — Ambulatory Visit (INDEPENDENT_AMBULATORY_CARE_PROVIDER_SITE_OTHER): Payer: 59 | Admitting: Obstetrics and Gynecology

## 2012-10-08 VITALS — BP 130/70 | HR 86 | Resp 18 | Ht 65.0 in | Wt 226.0 lb

## 2012-10-08 DIAGNOSIS — Z124 Encounter for screening for malignant neoplasm of cervix: Secondary | ICD-10-CM

## 2012-10-08 NOTE — Progress Notes (Signed)
Annual:  Last Pap: 09/18/11 WNL: Yes HPV-detected HPV 16/18=not detected Regular Periods:no Contraception: BTL  Monthly Breast exam:yes Tetanus<44yrs:yes Nl.Bladder Function:yes Daily BMs:yes Healthy Diet:yes Calcium:yes Mammogram:yes Date of Mammogram: 09/07/2012 Exercise:yes Have often Exercise: Everyday "tredmill" Seatbelt: yes Abuse at home: no Stressful work:yes Sigmoid-colonoscopy: 2008 per pt . Due next month with [facility] Bone Density: Yes 01/04/2011 PCP: Rayetta Pigg Change in PMH: No Changes Change in FMH:No Changes  Subjective:    Sharon Montgomery is a 56 y.o. female G4P4 who presents for annual exam.  The patient has no complaints today.   The following portions of the patient's history were reviewed and updated as appropriate: allergies, current medications, past family history, past medical history, past social history, past surgical history and problem list.  Review of Systems Pertinent items are noted in HPI. Gastrointestinal:No change in bowel habits, no abdominal pain, no rectal bleeding Genitourinary:negative for dysuria, frequency, hematuria, nocturia and urinary incontinence    Objective:    BP 130/70  Pulse 86  Resp 18  Ht 5\' 5"  (1.651 m)  Wt 226 lb (102.513 kg)  BMI 37.61 kg/m2  Ht 5\' 5"  (1.651 m)  Wt 226 lb (102.513 kg)  BMI 37.61 kg/m2  Weight:  Wt Readings from Last 1 Encounters:  10/08/12 226 lb (102.513 kg)     BMI: Body mass index is 37.61 kg/(m^2). General Appearance: Alert, appropriate appearance for age. No acute distress HEENT: Grossly normal Neck / Thyroid: Supple, no masses, nodes or enlargement Lungs: clear to auscultation bilaterally Back: No CVA tenderness Breast Exam: Left:  S/p mastectomy                          Right: no masses.  Submammary fungal eruption Cardiovascular: Regular rate and rhythm. S1, S2, no murmur Gastrointestinal: Soft, non-tender, no masses or organomegaly.  Increased BMI limits exam Pelvic Exam:  External genitalia: normal general appearance Vaginal: atrophic mucosa Cervix: normal appearance Adnexa: non palpable Uterus: doesn't feel enlarged Exam limited by body habitus Rectovaginal: normal rectal, no masses Lymphatic Exam: Non-palpable nodes in neck, clavicular, axillary, or inguinal regions Skin: no rash or abnormalities Neurologic: Normal gait and speech, no tremor  Psychiatric: Alert and oriented, appropriate affect.    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

## 2012-10-13 LAB — HPV TYPE 16/18
HPV Genotype, 16: NOT DETECTED
HPV Genotype, 18: NOT DETECTED

## 2012-10-21 ENCOUNTER — Emergency Department (HOSPITAL_COMMUNITY)
Admission: EM | Admit: 2012-10-21 | Discharge: 2012-10-22 | Disposition: A | Discharge status: Home / Self Care | Payer: 59 | Attending: Emergency Medicine | Admitting: Emergency Medicine

## 2012-10-21 ENCOUNTER — Encounter (HOSPITAL_COMMUNITY): Payer: Self-pay | Admitting: *Deleted

## 2012-10-21 DIAGNOSIS — E785 Hyperlipidemia, unspecified: Secondary | ICD-10-CM | POA: Insufficient documentation

## 2012-10-21 DIAGNOSIS — I10 Essential (primary) hypertension: Secondary | ICD-10-CM | POA: Insufficient documentation

## 2012-10-21 DIAGNOSIS — N3001 Acute cystitis with hematuria: Secondary | ICD-10-CM

## 2012-10-21 DIAGNOSIS — E119 Type 2 diabetes mellitus without complications: Secondary | ICD-10-CM | POA: Insufficient documentation

## 2012-10-21 DIAGNOSIS — Z860100 Personal history of colon polyps, unspecified: Secondary | ICD-10-CM | POA: Insufficient documentation

## 2012-10-21 DIAGNOSIS — Z853 Personal history of malignant neoplasm of breast: Secondary | ICD-10-CM | POA: Insufficient documentation

## 2012-10-21 DIAGNOSIS — Z79899 Other long term (current) drug therapy: Secondary | ICD-10-CM | POA: Insufficient documentation

## 2012-10-21 DIAGNOSIS — Z7982 Long term (current) use of aspirin: Secondary | ICD-10-CM | POA: Insufficient documentation

## 2012-10-21 DIAGNOSIS — N3000 Acute cystitis without hematuria: Secondary | ICD-10-CM | POA: Insufficient documentation

## 2012-10-21 DIAGNOSIS — E559 Vitamin D deficiency, unspecified: Secondary | ICD-10-CM | POA: Insufficient documentation

## 2012-10-21 DIAGNOSIS — R319 Hematuria, unspecified: Secondary | ICD-10-CM | POA: Insufficient documentation

## 2012-10-21 DIAGNOSIS — D72829 Elevated white blood cell count, unspecified: Secondary | ICD-10-CM | POA: Insufficient documentation

## 2012-10-21 LAB — URINALYSIS, ROUTINE W REFLEX MICROSCOPIC
Bilirubin Urine: NEGATIVE
Nitrite: NEGATIVE
Specific Gravity, Urine: 1.023 (ref 1.005–1.030)
Urobilinogen, UA: 1 mg/dL (ref 0.0–1.0)

## 2012-10-21 LAB — URINE MICROSCOPIC-ADD ON

## 2012-10-21 MED ORDER — SODIUM CHLORIDE 0.9 % IV BOLUS (SEPSIS)
1000.0000 mL | Freq: Once | INTRAVENOUS | Status: AC
  Administered 2012-10-22: 1000 mL via INTRAVENOUS

## 2012-10-21 MED ORDER — ONDANSETRON HCL 4 MG/2ML IJ SOLN
4.0000 mg | Freq: Once | INTRAMUSCULAR | Status: AC
  Administered 2012-10-22: 4 mg via INTRAVENOUS
  Filled 2012-10-21: qty 2

## 2012-10-21 NOTE — ED Notes (Addendum)
Pt reports her dizziness has decreased.  Pt reports that she feels like she is spinning.

## 2012-10-21 NOTE — ED Notes (Signed)
Pt reports 2 episodes of hematuria tonight. Reports after 2nd time experiencing dizziness after seeing blood. Pt also reports dysuria. Pt reports dizziness persists.

## 2012-10-22 LAB — COMPREHENSIVE METABOLIC PANEL
AST: 15 U/L (ref 0–37)
CO2: 29 mEq/L (ref 19–32)
Calcium: 10 mg/dL (ref 8.4–10.5)
Creatinine, Ser: 0.71 mg/dL (ref 0.50–1.10)
GFR calc non Af Amer: >90 mL/min (ref >90)

## 2012-10-22 LAB — CBC WITH DIFFERENTIAL/PLATELET
Basophils Absolute: 0 10*3/uL (ref 0.0–0.1)
Basophils Relative: 0 % (ref 0–1)
Eosinophils Relative: 1 % (ref 0–5)
HCT: 38.2 % (ref 36.0–46.0)
Lymphocytes Relative: 24 % (ref 12–46)
MCH: 30.1 pg (ref 26.0–34.0)
MCHC: 34.6 g/dL (ref 30.0–36.0)
MCV: 87 fL (ref 78.0–100.0)
Monocytes Absolute: 1.1 10*3/uL — ABNORMAL HIGH (ref 0.1–1.0)
RDW: 12.6 % (ref 11.5–15.5)

## 2012-10-22 MED ORDER — SODIUM CHLORIDE 0.9 % IV SOLN
Freq: Once | INTRAVENOUS | Status: AC
  Administered 2012-10-22: 1000 mL via INTRAVENOUS

## 2012-10-22 MED ORDER — CIPROFLOXACIN HCL 500 MG PO TABS: 500.0000 mg | ORAL_TABLET | Freq: Two times a day (BID) | ORAL | Status: DC

## 2012-10-22 MED ORDER — CIPROFLOXACIN IN D5W 400 MG/200ML IV SOLN
400.0000 mg | Freq: Once | INTRAVENOUS | Status: AC
  Administered 2012-10-22: 400 mg via INTRAVENOUS
  Filled 2012-10-22: qty 200

## 2012-10-22 MED ORDER — ONDANSETRON HCL 4 MG PO TABS: 4.0000 mg | ORAL_TABLET | Freq: Four times a day (QID) | ORAL | Status: DC

## 2012-10-22 NOTE — ED Provider Notes (Signed)
History     CSN: [id number]  Arrival date & time 10/21/12  2022   First MD Initiated Contact with Patient 10/21/12 2305      Chief Complaint  Patient presents with  . Hematuria  . Dizziness    (Consider location/radiation/quality/duration/timing/severity/associated sxs/prior treatment) HPI 56 year old female presents to emergency apartment complaining of 2 episodes of hematuria this evening. Patient reports she is having some pain with urination. She has seen clots passed. Patient reports her last menstrual period was 5-6 years ago. Patient has seen some blood with wiping as well is in the posterior pole. No prior history of kidney stone, hematuria. Patient reports since onset of hematuria, she has been feeling dizzy. Dizzy is described as near syncope or feeling lightheaded. She denies dizziness as in vertigo. Patient denies any vomiting, no change in medications no lower abdominal pain or flank pain. She has had nausea.  Past Medical History  Diagnosis Date  . Dyslipidemia   . Vitamin D deficiency   . History of breast cancer 2000  . Diabetes mellitus     TYPE 2  . Cancer     BREAST  . Hypertension     controlled with diet  . Colon polyp 2008    adenomatous, colonoscopy every 5 years    Past Surgical History  Procedure Date  . Colonoscopy 2008  . Cesarean section   . Tonsillectomy   . Shoulder surgery     PIN PLACED IN RIGHT SHOULDER  . Mastectomy     LEFT SIDE    Family History  Problem Relation Age of Onset  . Hypertension Father   . Diabetes Mother     History  Substance Use Topics  . Smoking status: Never Smoker   . Smokeless tobacco: Never Used  . Alcohol Use: No    OB History    Grav Para Term Preterm Abortions TAB SAB Ect Mult Living   4 4        4       Review of Systems  See History of Present Illness; otherwise all other systems are reviewed and negative  Allergies  Lisinopril  Home Medications   Current Outpatient Rx  Name  Route   Sig  Dispense  Refill  . ASPIRIN 81 MG PO TABS   Oral   Take 81 mg by mouth daily.           . ATORVASTATIN CALCIUM 40 MG PO TABS   Oral   Take 40 mg by mouth daily.         Marland Kitchen CLARITIN-D 12 HOUR PO   Oral   Take 1 tablet by mouth daily.           . ADULT MULTIVITAMIN W/MINERALS CH   Oral   Take 1 tablet by mouth daily.         Marland Kitchen SAXAGLIPTIN-METFORMIN ER 2.04-999 MG PO TB24   Oral   Take 2 tablets by mouth daily.           BP 146/99  Pulse 109  Temp 98 F (36.7 C) (Oral)  Resp 20  SpO2 100%  Physical Exam  Nursing note and vitals reviewed. Constitutional: She is oriented to person, place, and time. She appears well-developed and well-nourished.  HENT:  Head: Normocephalic and atraumatic.  Nose: Nose normal.  Mouth/Throat: Oropharynx is clear and moist.  Eyes: Conjunctivae normal and EOM are normal. Pupils are equal, round, and reactive to light.  Neck: Normal range of motion. Neck supple.  No JVD present. No tracheal deviation present. No thyromegaly present.  Cardiovascular: Normal rate, regular rhythm, normal heart sounds and intact distal pulses.  Exam reveals no gallop and no friction rub.   No murmur heard. Pulmonary/Chest: Effort normal and breath sounds normal. No stridor. No respiratory distress. She has no wheezes. She has no rales. She exhibits no tenderness.  Abdominal: Soft. Bowel sounds are normal. She exhibits no distension and no mass. There is no tenderness. There is no rebound and no guarding.  Musculoskeletal: Normal range of motion. She exhibits no edema and no tenderness.  Lymphadenopathy:    She has no cervical adenopathy.  Neurological: She is alert and oriented to person, place, and time. She exhibits normal muscle tone. Coordination normal.  Skin: Skin is warm and dry. No rash noted. No erythema. No pallor.  Psychiatric: She has a normal mood and affect. Her behavior is normal. Judgment and thought content normal.    ED Course    Procedures (including critical care time)  Labs Reviewed  URINALYSIS, ROUTINE W REFLEX MICROSCOPIC - Abnormal; Notable for the following:    Color, Urine RED (*)  BIOCHEMICALS MAY BE AFFECTED BY COLOR   APPearance CLOUDY (*)     Glucose, UA 250 (*)     Hgb urine dipstick LARGE (*)     Ketones, ur TRACE (*)     Protein, ur 100 (*)     Leukocytes, UA SMALL (*)     All other components within normal limits  CBC WITH DIFFERENTIAL - Abnormal; Notable for the following:    WBC 17.2 (*)     Neutro Abs 11.8 (*)     Lymphs Abs 4.2 (*)     Monocytes Absolute 1.1 (*)     All other components within normal limits  COMPREHENSIVE METABOLIC PANEL - Abnormal; Notable for the following:    Glucose, Bld 169 (*)     All other components within normal limits  URINE MICROSCOPIC-ADD ON  URINE CULTURE   No results found.   1. Cystitis, acute hemorrhagic   2. Leukocytosis       MDM  D64-year-old female with reported hematuria and dizziness. Patient noted to have a elevated white blood cell count of 17.2. Her hemoglobin is normal. Urine without WBCs, too numerous to count RBCs. We'll do pelvic exam to check urethral opening as well as to see if possible blood from vagina. Patient may have diabetes/pyelonephritis. Will send urine for culture.        Olivia Mackie, MD 10/22/12 [phone]

## 2012-10-24 LAB — URINE CULTURE

## 2012-10-25 NOTE — ED Notes (Signed)
+  Urine. Patient given Cipro. No sensitivity listed. Chart sent to EDP office for review. °

## 2012-10-26 ENCOUNTER — Encounter: Payer: Self-pay | Admitting: Family Medicine

## 2012-10-26 ENCOUNTER — Ambulatory Visit (INDEPENDENT_AMBULATORY_CARE_PROVIDER_SITE_OTHER): Payer: 59 | Admitting: Family Medicine

## 2012-10-26 VITALS — BP 138/84 | HR 76 | Temp 97.7°F | Ht 64.0 in | Wt 223.0 lb

## 2012-10-26 DIAGNOSIS — E119 Type 2 diabetes mellitus without complications: Secondary | ICD-10-CM

## 2012-10-26 DIAGNOSIS — Z23 Encounter for immunization: Secondary | ICD-10-CM

## 2012-10-26 DIAGNOSIS — N390 Urinary tract infection, site not specified: Secondary | ICD-10-CM

## 2012-10-26 LAB — POCT URINALYSIS DIPSTICK
Bilirubin, UA: NEGATIVE
Glucose, UA: NEGATIVE
Ketones, UA: NEGATIVE
Spec Grav, UA: >=1.03
Urobilinogen, UA: NEGATIVE

## 2012-10-26 MED ORDER — SAXAGLIPTIN-METFORMIN ER 2.5-1000 MG PO TB24: 2.0000 | ORAL_TABLET | Freq: Every day | ORAL | Status: DC

## 2012-10-26 MED ORDER — NITROFURANTOIN MONOHYD MACRO 100 MG PO CAPS: 100.0000 mg | ORAL_CAPSULE | Freq: Two times a day (BID) | ORAL | Status: DC

## 2012-10-26 MED ORDER — FLUCONAZOLE 150 MG PO TABS: 150.0000 mg | ORAL_TABLET | Freq: Once | ORAL | Status: DC

## 2012-10-26 NOTE — Patient Instructions (Signed)
Stop the Cipro.  Instead, start macrodantin twice daily for a week. Call if you develop fevers, chills, flank pain, vomiting or other worsening symptoms (or return for re-evaluation).  Take the fluconazole only if you develop vaginal itching, discharge (to treat yeast infection).

## 2012-10-26 NOTE — Progress Notes (Signed)
Chief Complaint  Patient presents with  . Follow-up    seen at ER on 11/20, given Cipro for UTI, has 3 doses left. Does not feel like infection is completely gone. Has colonoscopy for Monday wants to make sure she can still have this procedure done.    HPI:  Patient presents for ER follow-up from UTI.  She went to ER 11/20 with hematuria and dizziness, lightheaded.  She was put on cipro, but is still symptomatic.  Urine culture showed coag negative staph, sensitive to levaquin (as well as nitrofurantoin, and tetracycline, resistant to PCN).  Was noted to have WBC of 17.2.  She continues to have some ongoing burning with urination.  No further blood in urine.  Still having some urgency and frequency.  Denies fevers, no vomiting or diarrhea.  Some mild nausea.  Denies any flank pain. She denies rash, itching, vaginal discharge  She had seen Dr. Pennie Rushing on 11/7 for CPE, but had seen PA on 10/1 with 3 weeks of dysuria.  Diagnosed with yeast (rash also present).  U/a done 9/30 3+ glucose, otherwise negative.  She was treated with cream and foam.  Symptoms improved somewhat.  CPE with Sharon Montgomery on 11/7 didn't mention further problems, no rash. (records all reviewed).  DM--sugars are doing much better since on Kombiglyze.  But it is very expensive.  Was [facility], but [facility] doesn't accept the coupons, costing $150/90 days.  Only got it filled twice [facility] prior to changing to [facility].  Past Medical History  Diagnosis Date  . Dyslipidemia   . Vitamin D deficiency   . History of breast cancer 2000  . Diabetes mellitus     TYPE 2  . Cancer     BREAST  . Hypertension     controlled with diet  . Colon polyp 2008    adenomatous, colonoscopy every 5 years   Past Surgical History  Procedure Date  . Colonoscopy 2008  . Cesarean section   . Tonsillectomy   . Shoulder surgery     PIN PLACED IN RIGHT SHOULDER  . Mastectomy     LEFT SIDE   Current Outpatient Prescriptions on File  Prior to Visit  Medication Sig Dispense Refill  . aspirin 81 MG tablet Take 81 mg by mouth daily.        . Multiple Vitamin (MULTIVITAMIN WITH MINERALS) TABS Take 1 tablet by mouth daily.      . [DISCONTINUED] Saxagliptin-Metformin 2.04-999 MG TB24 Take 2 tablets by mouth daily.      Marland Kitchen atorvastatin (LIPITOR) 40 MG tablet Take 40 mg by mouth daily.      . Loratadine-Pseudoephedrine (CLARITIN-D 12 HOUR PO) Take 1 tablet by mouth daily.        . ondansetron (ZOFRAN) 4 MG tablet Take 1 tablet (4 mg total) by mouth every 6 (six) hours. PRN nausea  12 tablet  0  Cipro 500mg  BID (from ER)   Allergies  Allergen Reactions  . Lisinopril Cough   ROS:  Denies fevers, URI symptoms, chest pain, cough, shortness of breath, back pain, nausea, vomiting, diarrhea, vaginal discharge, vaginal bleeding, hematuria, skin rash or other concerns.  PHYSICAL EXAM: BP 138/84  Pulse 76  Temp 97.7 F (36.5 C) (Oral)  Ht 5\' 4"  (1.626 m)  Wt 223 lb (101.152 kg)  BMI 38.28 kg/m2 Well developed pleasant female in no distress Heart: regular rate and rhythm Lungs: clear bilaterally Back: no CVA tenderness Abdomen: soft, nontender, no  mass  Urine dip: trace leuk, 1+ protein, 1+ blood.  ASSESSMENT/PLAN: 1. UTI (lower urinary tract infection)  POCT Urinalysis Dipstick, nitrofurantoin, macrocrystal-monohydrate, (MACROBID) 100 MG capsule  2. Type II or unspecified type diabetes mellitus without mention of complication, not stated as uncontrolled  Saxagliptin-Metformin 2.04-999 MG TB24  3. Need for prophylactic vaccination and inoculation against influenza  Flu vaccine greater than or equal to 3yo preservative free IM    UTI--partial response in that she is no longer having hematuria, but having ongoing symptoms despite 5 days of Cipro.  Given sensitivity results, change to Macrobid.  rx diflucan--to pick up only if needed  She declines CBC repeat today (to recheck WBC)--prefers to do at her visit 12/9 when other  labs are due.  Rx written (she gets drawn at work).  DM--[facility] get [facility] allowed local refill through [facility] (since is cost-saving)  F/u in December with labs prior, as scheduled, sooner prn if symptoms don't completely resolve

## 2012-10-28 NOTE — ED Notes (Signed)
Patient notified. Patient did f/u with PCP put on Abx already.

## 2012-10-28 NOTE — ED Notes (Signed)
Chart returned from EDP office  With orders written by Rhea Bleacher to switch to Macrobid 100 mg twice a day for 7 days.

## 2012-11-02 ENCOUNTER — Ambulatory Visit: Payer: Self-pay | Admitting: Obstetrics and Gynecology

## 2012-11-09 ENCOUNTER — Encounter: Payer: Self-pay | Admitting: Family Medicine

## 2012-11-09 ENCOUNTER — Ambulatory Visit (INDEPENDENT_AMBULATORY_CARE_PROVIDER_SITE_OTHER): Payer: 59 | Admitting: Family Medicine

## 2012-11-09 VITALS — BP 130/70 | HR 76 | Ht 64.0 in | Wt 224.0 lb

## 2012-11-09 DIAGNOSIS — E119 Type 2 diabetes mellitus without complications: Secondary | ICD-10-CM

## 2012-11-09 DIAGNOSIS — E559 Vitamin D deficiency, unspecified: Secondary | ICD-10-CM | POA: Insufficient documentation

## 2012-11-09 DIAGNOSIS — IMO0001 Reserved for inherently not codable concepts without codable children: Secondary | ICD-10-CM

## 2012-11-09 DIAGNOSIS — N390 Urinary tract infection, site not specified: Secondary | ICD-10-CM

## 2012-11-09 DIAGNOSIS — E785 Hyperlipidemia, unspecified: Secondary | ICD-10-CM

## 2012-11-09 LAB — POCT URINALYSIS DIPSTICK
Bilirubin, UA: NEGATIVE
Glucose, UA: 50
Ketones, UA: NEGATIVE
Spec Grav, UA: 1.025

## 2012-11-09 MED ORDER — PIOGLITAZONE HCL 15 MG PO TABS: 15.0000 mg | ORAL_TABLET | Freq: Every day | ORAL | Status: DC

## 2012-11-09 MED ORDER — ERGOCALCIFEROL 1.25 MG (50000 UT) PO CAPS: 50000.0000 [IU] | ORAL_CAPSULE | ORAL | Status: DC

## 2012-11-09 NOTE — Progress Notes (Signed)
Chief Complaint  Patient presents with  . Diabetes    nonfasting med check, labs done at [facility]-I will call to try to get results. Also was seen here 10/26/12 for UTI, patient feels as if this has not cleared up completely.   HPI:  Diabetes follow-up:  Blood sugars at home are running 130-140 in the mornings, only rarely has checked it later in the day and it was 120's.  Denies hypoglycemia.  Denies polydipsia and polyuria.  Last eye exam was January 2013.  Patient follows a low sugar diet and checks feet regularly without concerns.  She reports being compliant with taking her kombiglyze--is splitting it BID, and only once has forgotten second dose.  Denies GI side effects. She just recently got 90 day supply in mail.  Hyperlipidemia follow-up:  Patient is reportedly following a low-fat, low cholesterol diet.  Compliant with medications and denies medication side effects.  She had been changed to Lipitor 40mg  back in September (due to being above goal on 40mg  of simvastatin, LDL of 120), however still had so much of the simvastatin left over that she hasn't actually started the lipitor yet.  Still has rx on file.  UTI--has intermittent burning sensation.  No longer having any urgency, frequency or any blood in urine.  She completed the macrobid, and did need to use the diflucan.  Denies any vaginal discharge or itching.  Vitamin D deficiency--completed Rx (recalls taking it only about a month).  She has been taking OTC Vitamin D since then, but doesn't recall the dose.  Exercising on treadmill 20 minutes every day at work (5 days/week).  Not exercising on weekends.   Past Medical History  Diagnosis Date  . Dyslipidemia   . Vitamin D deficiency   . History of breast cancer 2000  . Diabetes mellitus     TYPE 2  . Cancer     BREAST  . Hypertension     controlled with diet  . Colon polyp 2008    adenomatous, colonoscopy every 5 years   Past Surgical History  Procedure Date  .  Colonoscopy 2008  . Cesarean section   . Tonsillectomy   . Shoulder surgery     PIN PLACED IN RIGHT SHOULDER  . Mastectomy     LEFT SIDE   History   Social History  . Marital Status: Married    Spouse Name: N/A    Number of Children: N/A  . Years of Education: N/A   Occupational History  . Not on file.   Social History Main Topics  . Smoking status: Never Smoker   . Smokeless tobacco: Never Used  . Alcohol Use: No  . Drug Use: No  . 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 Not on file   Social History Narrative  . No narrative on file   Current Outpatient Prescriptions on File Prior to Visit  Medication Sig Dispense Refill  . aspirin 81 MG tablet Take 81 mg by mouth daily.        Marland Kitchen atorvastatin (LIPITOR) 40 MG tablet Take 40 mg by mouth daily.      . Loratadine-Pseudoephedrine (CLARITIN-D 12 HOUR PO) Take 1 tablet by mouth daily.        . Multiple Vitamin (MULTIVITAMIN WITH MINERALS) TABS Take 1 tablet by mouth daily.      Marland Kitchen  Saxagliptin-Metformin 2.04-999 MG TB24 Take 2 tablets by mouth daily.  60 tablet  1  . ondansetron (ZOFRAN) 4 MG tablet Take 1 tablet (4 mg total) by mouth every 6 (six) hours. PRN nausea  12 tablet  0   Allergies  Allergen Reactions  . Lisinopril Cough   ROS:  Denies fevers, weight changes, URI symptoms, headaches, dizziness, chest pain, palpitations, cough, shortness of breath, nausea, vomiting, bowel changes, bleeding, bruising, rashes, numbness/tingling, depression, or other concerns.  See HPI.  PHYSICAL EXAM: BP 130/70  Pulse 76  Ht 5\' 4"  (1.626 m)  Wt 224 lb (101.606 kg)  BMI 38.45 kg/m2 Well developed, pleasant, obese female in no distress , overweight Neck: no lymphadenopathy, thyromegaly or mass, no carotid bruit  Heart: regular rate and rhythm without murmur  Lungs: clear bilaterally   Abdomen: soft, nontender, no organomegaly or mass  Extremities: no edema, normal pulses  Skin: no rash  Psych: normal mood, affect, hygiene and grooming  Neuro: cranial nerves grossly intact. Normal gait. Alert and oriented  Labs faxed over from [facility] (which weren't received until 20 minutes after appt time) A1c 8.3 CBC normal, WBC 10.0 (down from 17, improved), Hg 13.0 Chem:  Glucose 132, otherwise c-met all normal TSH: 1.850 Vitamin D-OH 18.5 (low) Total chol 223, TG 140, HDL 83, LDL 112 (improved, but still not at goal)  ASSESSMENT/PLAN:  1. Type II or unspecified type diabetes mellitus without mention of complication, uncontrolled  pioglitazone (ACTOS) 15 MG tablet  2. Urinary tract infection, site not specified  POCT Urinalysis Dipstick  3. Type II or unspecified type diabetes mellitus without mention of complication, not stated as uncontrolled  Microalbumin / creatinine urine ratio  4. Hyperlipidemia    5. Unspecified vitamin D deficiency  ergocalciferol (VITAMIN D2) 50000 UNITS capsule   DM--suboptimally controlled on onglyza alone.  Just got 90 day supply in the mail (so declined changing, had suggested Oseni).  Add 15 mg pioglitazone.  Continue diet/increase exercise, work harder for weight loss.    Hyperlipidemia--never changed from simvastatin to lipitor. Still above goal.  Unable to use the $4 copay card through mail order for Lipitor, needs generic atorvastatin 40mg --she states rx is on file, and she will request generic.  Repeat LFT's and lipids in 3 months.   Vitamin D--rx 50,000 IU once weekly x 12 weeks.  She needs to let us know what OTC dose she is currently taking (that is insufficient) so that we can make a recommendation of an OTC dose for after the rx is completed.  F/u 3 months--labs prior (A1c, lipids, lft's) Check Vitamin D in 6 months--but let her know what OTC dose to change to at her next visit, when she lets [place] know how much OTC taking prior to rx  (currently taking)  Advised pt that if getting her labs done at [facility], it is her responsibility to ensure that results are here PRIOR to appointment time, otherwise she will be asked to reschedule appt

## 2012-11-09 NOTE — Patient Instructions (Addendum)
Increase exercise to at least 30-60 minutes, 5-6 days/week.  Add pioglitazone 15 mg daily to your current dose of kombiglyze. Change from simvastatin to lipitor (atorvastatin). Start weekly prescription vitamin D.  I will need to know what your current dose of Vitamin D is in order to make a recommendation of what to do after prescription vitamin D is completed (in 3 months)  Return in 3 months, get fasting done prior to appointment.  You need to make sure that I have lab results at the time of your visit or your visit will need to be rescheduled

## 2012-11-10 LAB — MICROALBUMIN / CREATININE URINE RATIO
Creatinine, Urine: 372.7 mg/dL
Microalb, Ur: 1.96 mg/dL — ABNORMAL HIGH (ref 0.00–1.89)

## 2012-11-11 ENCOUNTER — Encounter: Payer: Self-pay | Admitting: Family Medicine

## 2012-12-24 ENCOUNTER — Telehealth: Payer: Self-pay | Admitting: Family Medicine

## 2012-12-24 DIAGNOSIS — IMO0001 Reserved for inherently not codable concepts without codable children: Secondary | ICD-10-CM

## 2012-12-24 DIAGNOSIS — E559 Vitamin D deficiency, unspecified: Secondary | ICD-10-CM

## 2012-12-24 MED ORDER — ERGOCALCIFEROL 1.25 MG (50000 UT) PO CAPS: 50000.0000 [IU] | ORAL_CAPSULE | ORAL | Status: AC

## 2012-12-24 MED ORDER — PIOGLITAZONE HCL 15 MG PO TABS: 15.0000 mg | ORAL_TABLET | Freq: Every day | ORAL | Status: DC

## 2012-12-24 NOTE — Telephone Encounter (Signed)
Done, sent to [facility]

## 2013-01-21 ENCOUNTER — Encounter: Payer: Self-pay | Admitting: Family Medicine

## 2013-02-08 ENCOUNTER — Encounter: Payer: 59 | Admitting: Family Medicine

## 2013-03-04 ENCOUNTER — Ambulatory Visit
Admission: RE | Admit: 2013-03-04 | Discharge: 2013-03-04 | Disposition: A | Discharge status: Home / Self Care | Payer: 59 | Source: Ambulatory Visit | Attending: Orthopedic Surgery | Admitting: Orthopedic Surgery

## 2013-03-04 ENCOUNTER — Other Ambulatory Visit: Payer: Self-pay | Admitting: Orthopedic Surgery

## 2013-03-04 DIAGNOSIS — R52 Pain, unspecified: Secondary | ICD-10-CM

## 2013-04-23 ENCOUNTER — Telehealth: Payer: Self-pay | Admitting: Family Medicine

## 2013-04-23 DIAGNOSIS — E119 Type 2 diabetes mellitus without complications: Secondary | ICD-10-CM

## 2013-04-23 MED ORDER — SAXAGLIPTIN-METFORMIN ER 2.5-1000 MG PO TB24: 2.0000 | ORAL_TABLET | Freq: Every day | ORAL | Status: DC

## 2013-04-23 NOTE — Telephone Encounter (Signed)
DR.KNAPP I DO NOT SEE THIS MED IN HER CHART

## 2013-04-23 NOTE — Telephone Encounter (Signed)
It is on her med list.  I refilled it x 30 days.  She is past due for med check--she was actually due in March.  And she is due for labs, so make it fasting med check

## 2013-04-27 ENCOUNTER — Telehealth: Payer: Self-pay | Admitting: Family Medicine

## 2013-04-27 DIAGNOSIS — E119 Type 2 diabetes mellitus without complications: Secondary | ICD-10-CM

## 2013-04-27 MED ORDER — SAXAGLIPTIN-METFORMIN ER 2.5-1000 MG PO TB24: 2.0000 | ORAL_TABLET | Freq: Every day | ORAL | Status: DC

## 2013-04-27 NOTE — Telephone Encounter (Signed)
Sent in med to mail order

## 2013-04-29 ENCOUNTER — Telehealth: Payer: Self-pay | Admitting: Family Medicine

## 2013-04-29 NOTE — Telephone Encounter (Signed)
[facility] refill req Kombiglyze XR TAB 2.04-999

## 2013-05-20 ENCOUNTER — Encounter: Payer: Self-pay | Admitting: Family Medicine

## 2013-06-28 ENCOUNTER — Encounter: Payer: Self-pay | Admitting: *Deleted

## 2013-06-28 ENCOUNTER — Encounter: Payer: 59 | Attending: Family Medicine | Admitting: *Deleted

## 2013-06-28 VITALS — Ht 65.0 in | Wt 228.3 lb

## 2013-06-28 DIAGNOSIS — Z713 Dietary counseling and surveillance: Secondary | ICD-10-CM | POA: Insufficient documentation

## 2013-06-28 DIAGNOSIS — E119 Type 2 diabetes mellitus without complications: Secondary | ICD-10-CM

## 2013-06-28 NOTE — Progress Notes (Signed)
Appt start time: 0900 end time:  1000.  Assessment:  Patient was seen on  06/28/2013 for individual diabetes education. 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 Enjoys sewing. Used to walk every AM on equipment at work and at home. Not doing right now stating due to stress. Sewing relieves stress better than the walking. Enjoys gardening also. Lives with husband, he shops and prepares the meals. Not SMBG as meter is broken now. Used to check in AM daily.   Current HbA1c: 7.9%  MEDICATIONS: see list   DIETARY INTAKE:  Usual eating pattern includes 3 meals and 1-2 snacks per day.  Everyday foods include good variety of all food groups.  Avoided foods include high calorie foods usually.    24-hr recall:  B ( AM): unsweetened cereal with skim milk, occasionally coffee with cream and Splenda or honey OR eat out on Saturday AM Snk ( AM): occasionally fresh fruit  L ( PM): buys frozen dinner - Smart Ones, Diet Coke or Crystal Light Snk ( PM): fresh fruit D ( PM): meat, starch, vegetables, occasionally salad, Diet Coke or Crystal Light  Snk ( PM): none Beverages: coffee with cream and Splenda or honey, Diet Coke or Crystal Light,   Usual physical activity: none lately  Estimated energy needs: 1500 calories 170 g carbohydrates 112 g protein 42 g fat  Progress Towards Goal(s):  In progress.   Nutritional Diagnosis:  NB-1.1 Food and nutrition-related knowledge deficit As related to diabetes control.  As evidenced by A1c of 7.9%.    Intervention:  Nutrition counseling provided.  Discussed diabetes disease process and treatment options.  Discussed physiology of diabetes and role of obesity on insulin resistance.  Encouraged moderate weight reduction to improve glucose levels.  Discussed role of medications and diet in glucose control  Provided education on macronutrients on glucose levels.  Provided education on carb counting, importance of regularly scheduled  meals/snacks, and meal planning  Discussed effects of physical activity on glucose levels and long-term glucose control.  Recommended gradually working towards 150 minutes of physical activity/week.  Discussed blood glucose monitoring and interpretation.  Discussed recommended target ranges and individual ranges.    Described short-term complications: hyper- and hypo-glycemia.  Discussed causes,symptoms, and treatment options.  Handouts given during visit include: Living Well with Diabetes Carb Counting and Food Label handouts Meal Plan Card   Barriers to learning/adherance to lifestyle change: patient states stress affects her interest in exercise  Diabetes self-care support plan:  Aim for 2-3 Carb Choices per meal (30-45 grams) +/- 1 either way  Aim for 0-2 Carbs per snack if hungry  Consider reading food labels for Total Carbohydrate of foods Consider  increasing your activity level by walking or pool exercises daily as tolerated Consider obtaining a new meter so you can check BG at alternate times per day   Monitoring/Evaluation:  Dietary intake, exercise, reading food labels, and body weight prn. Patient chooses to call for appointment if she has any further questions.

## 2013-06-28 NOTE — Patient Instructions (Signed)
Plan:  Aim for 2-3 Carb Choices per meal (30-45 grams) +/- 1 either way  Aim for 0-2 Carbs per snack if hungry  Consider reading food labels for Total Carbohydrate of foods Consider  increasing your activity level by walking or pool exercises daily as tolerated Consider obtaining a new meter so you can check BG at alternate times per day

## 2013-09-07 ENCOUNTER — Other Ambulatory Visit: Payer: Self-pay | Admitting: Obstetrics and Gynecology

## 2013-09-07 DIAGNOSIS — Z1231 Encounter for screening mammogram for malignant neoplasm of breast: Secondary | ICD-10-CM

## 2013-09-15 ENCOUNTER — Other Ambulatory Visit: Payer: Self-pay | Admitting: Obstetrics and Gynecology

## 2013-09-15 ENCOUNTER — Ambulatory Visit (HOSPITAL_COMMUNITY)
Admission: RE | Admit: 2013-09-15 | Discharge: 2013-09-15 | Disposition: A | Discharge status: Home / Self Care | Payer: 59 | Source: Ambulatory Visit | Attending: Obstetrics and Gynecology | Admitting: Obstetrics and Gynecology

## 2013-09-15 DIAGNOSIS — Z1231 Encounter for screening mammogram for malignant neoplasm of breast: Secondary | ICD-10-CM | POA: Insufficient documentation

## 2014-01-14 ENCOUNTER — Other Ambulatory Visit: Payer: Self-pay | Admitting: Family Medicine

## 2014-05-30 ENCOUNTER — Other Ambulatory Visit: Payer: Self-pay | Admitting: Obstetrics and Gynecology

## 2014-05-30 DIAGNOSIS — Z1231 Encounter for screening mammogram for malignant neoplasm of breast: Secondary | ICD-10-CM

## 2014-06-01 ENCOUNTER — Ambulatory Visit: Payer: 59 | Admitting: Endocrinology

## 2014-06-02 ENCOUNTER — Other Ambulatory Visit: Payer: Self-pay | Admitting: *Deleted

## 2014-06-02 ENCOUNTER — Encounter: Payer: Self-pay | Admitting: Endocrinology

## 2014-06-02 ENCOUNTER — Ambulatory Visit (INDEPENDENT_AMBULATORY_CARE_PROVIDER_SITE_OTHER): Payer: 59 | Admitting: Endocrinology

## 2014-06-02 VITALS — BP 122/82 | HR 100 | Temp 98.0°F | Resp 16 | Ht 65.4 in | Wt 219.8 lb

## 2014-06-02 DIAGNOSIS — I10 Essential (primary) hypertension: Secondary | ICD-10-CM

## 2014-06-02 DIAGNOSIS — E785 Hyperlipidemia, unspecified: Secondary | ICD-10-CM

## 2014-06-02 DIAGNOSIS — E119 Type 2 diabetes mellitus without complications: Secondary | ICD-10-CM

## 2014-06-02 DIAGNOSIS — E1165 Type 2 diabetes mellitus with hyperglycemia: Principal | ICD-10-CM

## 2014-06-02 DIAGNOSIS — IMO0001 Reserved for inherently not codable concepts without codable children: Secondary | ICD-10-CM

## 2014-06-02 LAB — GLUCOSE, POCT (MANUAL RESULT ENTRY): POC Glucose: 225 mg/dl — AB (ref 70–99)

## 2014-06-02 MED ORDER — CANAGLIFLOZIN-METFORMIN HCL 50-1000 MG PO TABS: ORAL_TABLET | ORAL | Status: DC

## 2014-06-02 MED ORDER — GLIMEPIRIDE 2 MG PO TABS: 2.0000 mg | ORAL_TABLET | Freq: Every day | ORAL | Status: DC

## 2014-06-02 MED ORDER — FREESTYLE LANCETS MISC: Status: AC

## 2014-06-02 MED ORDER — GLUCOSE BLOOD VI STRP: ORAL_STRIP | Status: AC

## 2014-06-02 NOTE — Progress Notes (Signed)
Patient ID: Sharon Montgomery, female   DOB: 06/28/1956, 58 y.o.   MRN: [id number]    Reason for Appointment: Consultation for Type 2 Diabetes  Referring physician: Moreen Fowler  History of Present Illness:          Diagnosis: Type 2 diabetes mellitus, date of diagnosis: 2005       Past history:    She was diagnosed to have diabetes about 10 years ago when she complained about blurred vision and increased thirst Her glucose was over 300 at bedtime and her weight was about 245 pounds   She thinks she was treated with Avandia initially and she took this for about  8 years   At some point she was also given metformin which was continued   Because of somewhat worsening control over the last couple of years she was given Onglyza in addition to her metformin and she has been taking Kombiglyze for the last 1-2 years   Also pioglitazone  50 mg was started a few months ago to help with control   The details of previously none A1c  Reports are not available  Recent history:   She is now referred here for evaluation of poorly controlled diabetes  And A1c of 10.5% 16/12/15  She has not been monitoring her blood sugar for several months as she does not have a functioning monitor.  Lab glucose on 05/30/14 was 172  She tends to have blurred vision  When her blood sugars are higher       Oral hypoglycemic drugs the patient is taking are:  Actos, Kombiglyze    Side effects from medications have been: INSULIN regimen is described as:    Glucose monitoring: not  done  At all    Glycemic control:  Lab Results  Component Value Date   HGBA1C 8.6 06/04/2011   Lab Results  Component Value Date   MICROALBUR 1.96* 11/09/2012   CREATININE 0.71 10/22/2012    Self-care: The diet that the patient has been following is: tries to limit carbohydrates.  She started Weight Watchers last week, has done this before also. Generally has cereal for breakfast , eating out about 2-3 times a week , snacks are  mostly fruits. Usually having  Diet drinks    Meals: 3 meals per day.          Exercise: 4/7 days a week for about 20 minutes , mostly walking         Dietician visit: Most recent:2013.               Compliance with the medical regimen:  Retinal exam: Most recent:.    Weight history: previous range 199-245  Wt Readings from Last 3 Encounters:  06/02/14 219 lb 12.8 oz (99.701 kg)  06/28/13 228 lb 4.8 oz (103.556 kg)  11/09/12 224 lb (101.606 kg)      Medication List       This list is accurate as of: 06/02/14  5:07 PM.  Always use your most recent med list.               aspirin 81 MG tablet  Take 81 mg by mouth daily.     Canagliflozin-Metformin HCl 50-1000 MG Tabs  Commonly known as:  INVOKAMET  1 tablet twice a day with food     CLARITIN-D 12 HOUR PO  Take 1 tablet by mouth daily.     ergocalciferol 50000  UNITS capsule  Commonly known as:  VITAMIN D2  Take 1 capsule (50,000 Units total) by mouth once a week.     freestyle lancets  Use as instructed to check blood sugar one time per day dx code 250.00     glimepiride 2 MG tablet  Commonly known as:  AMARYL  Take 1 tablet (2 mg total) by mouth daily before supper.     glucose blood test strip  Commonly known as:  FREESTYLE LITE  Use as instructed to check blood sugar one time per day dx code 250.00     LIPITOR 40 MG tablet  Generic drug:  atorvastatin  Take 20 mg by mouth daily.     multivitamin with minerals Tabs tablet  Take 1 tablet by mouth daily.     ondansetron 4 MG tablet  Commonly known as:  ZOFRAN  Take 1 tablet (4 mg total) by mouth every 6 (six) hours. PRN nausea     pioglitazone 15 MG tablet  Commonly known as:  ACTOS  Take 1 tablet (15 mg total) by mouth daily.     Saxagliptin-Metformin 2.04-999 MG Tb24  Take 2 tablets by mouth daily.        Allergies:  Allergies  Allergen Reactions  . Lisinopril Cough    Past Medical History  Diagnosis Date  . Dyslipidemia   . Vitamin D  deficiency   . History of breast cancer 2000  . Diabetes mellitus     TYPE 2  . Cancer     BREAST  . Hypertension     controlled with diet  . Colon polyp 2008    adenomatous, colonoscopy every 5 years  . Hyperlipidemia   . Obesity     Past Surgical History  Procedure Laterality Date  . Colonoscopy  2008  . Cesarean section    . Tonsillectomy    . Shoulder surgery      PIN PLACED IN RIGHT SHOULDER  . Mastectomy      LEFT SIDE    Family History  Problem Relation Age of Onset  . Hypertension Father   . Diabetes Mother   . Diabetes Maternal Grandmother     Social History:  reports that she has never smoked. She has never used smokeless tobacco. She reports that she does not drink alcohol or use illicit drugs.    Review of Systems       Lipids:  She has been treated with Lipitor recently, her LDL in 6/15 was 204       No results found for this basename: CHOL,  HDL,  LDLCALC,  LDLDIRECT,  TRIG,  CHOLHDL        No unusual headaches.                  Skin: No rash or infections     Thyroid:  No  unusual fatigue.     The blood pressure has been  Normal  With losartan low-dose       She takes vitamin D supplements for presumed vitamin D deficiency     No swelling of feet.     No shortness of breath  Or chest discomfort on exertion.      She had breast cancer 15 years ago and was treated with surgery and chemotherapy     Bowel habits: Normal.       No frequency of urination or nocturia       No joint  pains.  No  depression           She does have some history of Numbness  For several years in feet , may have started after her chemotherapy for breast cancer     LABS:  Office Visit on 06/02/2014  Component Date Value Ref Range Status  . POC Glucose 06/02/2014 225* 70 - 99 mg/dl Final    Physical Examination:  BP 122/82  Pulse 100  Temp(Src) 98 F (36.7 C)  Resp 16  Ht 5' 5.4" (1.661 m)  Wt 219 lb 12.8 oz (99.701 kg)  BMI 36.14 kg/m2  SpO2  94%  GENERAL:         Patient has generalized obesity.   HEENT:         Eye exam shows normal external appearance. Fundus exam shows no retinopathy. Oral exam shows normal mucosa .  NECK:         General:  Neck exam shows no lymphadenopathy. Carotids are normal to palpation and no bruit heard.   Thyroid is not enlarged and no nodules felt.   LUNGS:         Chest is symmetrical. Lungs are clear to auscultation.Marland Kitchen   HEART:         Heart sounds:  S1 and S2 are normal. No murmurs or clicks heard., no S3 or S4.   ABDOMEN:   There is no distention present. Liver and spleen are not palpable. No other mass or tenderness present.  EXTREMITIES:     There is no edema. No skin lesions present.Marland Kitchen  NEUROLOGICAL:   Vibration sense is mildly reduced in the left and moderately on the right. Ankle jerks are absent, biceps reflexes are 1+ bilaterally.          Diabetic foot exam: Monofilament sensation appears normal MUSCULOSKELETAL:       There is no enlargement or deformity of the joints. Spine is normal to inspection.Marland Kitchen   SKIN:       No rash or lesions of concern.        ASSESSMENT:  Diabetes type 2, uncontrolled     Although she has had high blood sugars related to noncompliance with her medications her blood sugars are overall much higher than before and likely has had progression of her type 2 diabetes This was explained to the patient and the need for multiple drugs acting at various sites The blood sugar is still high in the office and unlikely that she is responding to a combination of low-dose pioglitazone, Onglyza and metformin. She has a BMI of 36 and she is likely to be insulin resistant; apparently previously had responded to monotherapy with Avandia For now would like to try her on a combination of metformin, 30 mg Actos as well as Invokana and glimepiride Discussed with the patient follow Invokana works to help blood sugar control, benefits on weight loss, possible side effects and treatment of  side effects. She will start with 790 mg a day  Complications: None evident  PLAN:  For convenience she can take Invokamet 50/1000 twice a day She will take Amaryl 2 mg at dinnertime and if blood sugars continue to be lower can reduce this to 1 mg She will take 30 mg of Actos, to use 2 tablets of 50 mg with current prescription Start checking blood sugars with FreeStyle monitor, alternating fasting and postprandial readings She will try to have a protein at breakfast consistently Continue walking on treadmill Review blood sugar control in 3 weeks, may  consider using a GLP-1 drug if not controlled   total visit time including counseling = 45  minutes  Caydin Yeatts 06/02/2014, 5:07 PM   Note: This office note was prepared with Estate agent. Any transcriptional errors that result from this process are unintentional.

## 2014-06-02 NOTE — Patient Instructions (Addendum)
Please check blood sugars at least half the time about 2 hours after any meal and 3 times per week on waking up. Please bring blood sugar monitor to each visit  Invokamet 2x daily at breakfast and supper  Glimeperide 2mg  1 at dinner. If the blood sugar comes down below 100 may reduce to half tablet  Pioglitazone/Actos: Take 30 mg daily   Have a protein at breakfast daily, avoid  eating cereal everyday

## 2014-06-14 ENCOUNTER — Telehealth: Payer: Self-pay | Admitting: Endocrinology

## 2014-06-14 NOTE — Telephone Encounter (Signed)
Yeast infection is from [place]. Please send a prescription for Diflucan 150 mg, one tablet and may refill one time

## 2014-06-14 NOTE — Telephone Encounter (Signed)
Glimepiride is causing some side effects yeast infection please assist

## 2014-06-14 NOTE — Telephone Encounter (Signed)
Please see below and advise. Thank you

## 2014-06-15 ENCOUNTER — Other Ambulatory Visit: Payer: Self-pay | Admitting: *Deleted

## 2014-06-15 MED ORDER — FLUCONAZOLE 150 MG PO TABS: 150.0000 mg | ORAL_TABLET | Freq: Once | ORAL | Status: DC

## 2014-06-15 NOTE — Telephone Encounter (Signed)
Noted, patient is aware, rx sent 

## 2014-06-17 ENCOUNTER — Telehealth: Payer: Self-pay | Admitting: Endocrinology

## 2014-06-17 NOTE — Telephone Encounter (Signed)
Patient stated that she is getting low readings on blood sugar, she would like to know what's normal and what's not.  Please advise

## 2014-06-27 ENCOUNTER — Encounter: Payer: Self-pay | Admitting: Endocrinology

## 2014-06-27 ENCOUNTER — Ambulatory Visit (INDEPENDENT_AMBULATORY_CARE_PROVIDER_SITE_OTHER): Payer: 59 | Admitting: Endocrinology

## 2014-06-27 ENCOUNTER — Telehealth: Payer: Self-pay | Admitting: Endocrinology

## 2014-06-27 ENCOUNTER — Other Ambulatory Visit: Payer: Self-pay | Admitting: *Deleted

## 2014-06-27 VITALS — BP 130/85 | HR 98 | Temp 98.3°F | Resp 16 | Ht 65.0 in | Wt 207.8 lb

## 2014-06-27 DIAGNOSIS — E119 Type 2 diabetes mellitus without complications: Secondary | ICD-10-CM

## 2014-06-27 NOTE — Telephone Encounter (Signed)
Please see below and advise.

## 2014-06-27 NOTE — Patient Instructions (Addendum)
Please check blood sugars at least half the time about 2 hours after any meal and 3 times per week on waking up. Please bring blood sugar monitor to each visit   Invokamet 2x daily at breakfast and supper    STOP Glimeperide  If the blood sugar GOES over 130 in am 100 may restart half tablet   Pioglitazone/Actos: Take 15 mg daily  Have a protein at breakfast daily, avoid eating cereal everyday

## 2014-06-27 NOTE — Progress Notes (Signed)
Patient ID: Sharon Montgomery, female   DOB: [birth year], 58 y.o.   MRN: [id number]    Reason for Appointment: Followup for Type 2 Diabetes  Referring physician: Moreen Fowler  History of Present Illness:          Diagnosis: Type 2 diabetes mellitus, date of diagnosis: 2005       Past history:    She was diagnosed to have diabetes about 10 years ago when she complained about blurred vision and increased thirst Her glucose was over 300 at bedtime and her weight was about 245 pounds   She thinks she was treated with Avandia initially and she took this for about  8 years   At some point she was also given metformin which was continued   Because of somewhat worsening control over the last couple of years she was given Onglyza in addition to her metformin and she has been taking Kombiglyze for the last 1-2 years   Also pioglitazone  15 mg was started a few months ago to help with control   The details of previously none A1c  Reports are not available  Recent history:   Prior to her referral she had a A1c of 10.5% on 05/13/14  Because of significantly high glucose readings she was started on a combination of Invokana and metformin along with continue her Actos. Also 2 mg Amaryl was added in the evening She did have an episode of candidiasis treated with Diflucan and has no other side effects from [place] now She has some blood sugars on a home diary and checking readings mostly in the morning Today her glucose was 77 and she felt a little weak Most of her readings are near normal but is checking postprandial readings rarely in the evening and usually 20 minutes after eating She appears not to have looked at her instructions from her previous visit Not clear if she is taking Actos which was supposed to be increased to 30 mg instead of 15 mg bottom       Oral hypoglycemic drugs the patient is taking are:  Actos, Invokamet, glimepiride    Side effects from medications have  been:  Glucose monitoring: She is using a family member"s One Touch monitor, having difficulty getting coverage for her strips from her insurance      Glycemic control:   A1c of 10.5% on 05/13/14   Lab Results  Component Value Date   HGBA1C 8.6 06/04/2011   Lab Results  Component Value Date   MICROALBUR 1.96* 11/09/2012   CREATININE 0.71 10/22/2012    Self-care: The diet that the patient has been following is: tries to limit carbohydrates.  She started Weight Watchers last week, has done this before also. Generally has cereal for breakfast , eating out about 2-3 times a week , snacks are mostly fruits. Usually having  Diet drinks    Meals: 3 meals per day.          Exercise: 4/7 days a week for about 20 minutes, mostly walking         Dietician visit: Most recent:2013.                 Weight history: previous range 199-245  Wt Readings from Last 3 Encounters:  06/27/14 207 lb 12.8 oz (94.257 kg)  06/02/14 219 lb 12.8 oz (99.701 kg)  06/28/13 228 lb 4.8 oz (103.556 kg)      Medication  List       This list is accurate as of: 06/27/14  3:41 PM.  Always use your most recent med list.               aspirin 81 MG tablet  Take 81 mg by mouth daily.     Canagliflozin-Metformin HCl 50-1000 MG Tabs  Commonly known as:  INVOKAMET  1 tablet twice a day with food     CLARITIN-D 12 HOUR PO  Take 1 tablet by mouth daily.     ergocalciferol 50000 UNITS capsule  Commonly known as:  VITAMIN D2  Take 1 capsule (50,000 Units total) by mouth once a week.     fluconazole 150 MG tablet  Commonly known as:  DIFLUCAN  Take 1 tablet (150 mg total) by mouth once.     freestyle lancets  Use as instructed to check blood sugar one time per day dx code 250.00     glimepiride 2 MG tablet  Commonly known as:  AMARYL  Take 1 tablet (2 mg total) by mouth daily before supper.     glucose blood test strip  Commonly known as:  FREESTYLE LITE  Use as instructed to check blood sugar one time  per day dx code 250.00     LIPITOR 40 MG tablet  Generic drug:  atorvastatin  Take 20 mg by mouth daily.     losartan 25 MG tablet  Commonly known as:  COZAAR  Take 25 mg by mouth daily.        Allergies:  Allergies  Allergen Reactions  . Lisinopril Cough    Past Medical History  Diagnosis Date  . Dyslipidemia   . Vitamin D deficiency   . History of breast cancer 2000  . Diabetes mellitus     TYPE 2  . Cancer     BREAST  . Hypertension     controlled with diet  . Colon polyp 2008    adenomatous, colonoscopy every 5 years  . Hyperlipidemia   . Obesity     Past Surgical History  Procedure Laterality Date  . Colonoscopy  2008  . Cesarean section    . Tonsillectomy    . Shoulder surgery      PIN PLACED IN RIGHT SHOULDER  . Mastectomy      LEFT SIDE    Family History  Problem Relation Age of Onset  . Hypertension Father   . Diabetes Mother   . Diabetes Maternal Grandmother     Social History:  reports that she has never smoked. She has never used smokeless tobacco. She reports that she does not drink alcohol or use illicit drugs.    Review of Systems       Lipids:  She has been treated with Lipitor recently, her LDL in 6/15 was 204       No results found for this basename: CHOL,  HDL,  LDLCALC,  LDLDIRECT,  TRIG,  CHOLHDL   Mild hypertension treated with only 25 mg Cozaar  Physical Examination:  BP 130/85  Pulse 98  Temp(Src) 98.3 F (36.8 C)  Resp 16  Ht 5\' 5"  (1.651 m)  Wt 207 lb 12.8 oz (94.257 kg)  BMI 34.58 kg/m2  SpO2 99%   Exam not indicated    ASSESSMENT:  Diabetes type 2, uncontrolled     Her blood sugars are markedly better with adding Invokana and Amaryl She is also taking metformin and not clear if she is taking Actos which was  supposed to be continued from before She has mostly fasting blood sugars and these are near-normal, lowest reading 77 today with some hypoglycemic symptoms She generally compliant with diet and  exercise  PLAN:  She will hold off on the Amaryl for now unless fasting blood sugars started going up over 130  Continue Actos and Invokamet Blood sugars either fasting or 2 hours after meals Will send her prescription for strips to [facility] 06/27/2014, 3:41 PM   Note: This office note was prepared with Dragon voice recognition system technology. Any transcriptional errors that result from this process are unintentional.

## 2014-06-27 NOTE — Telephone Encounter (Signed)
She is not taking actos does she need to take it?

## 2014-06-27 NOTE — Telephone Encounter (Signed)
Yes

## 2014-06-27 NOTE — Telephone Encounter (Signed)
At Check out patient stated that she get her labs drawn at [facility] in [place] for free, so if you could mail her a script or send it to [facility] of what is needed to be drawn.

## 2014-07-04 ENCOUNTER — Telehealth: Payer: Self-pay | Admitting: Endocrinology

## 2014-07-04 NOTE — Telephone Encounter (Signed)
Please see below and advise.

## 2014-07-04 NOTE — Telephone Encounter (Signed)
Patient is having diff with digestion  Food is staying in her chest and had not passed bowel in 5 days  Started about 3 weeks ago gradually getting worse  Patient believes its her medication   Please advise   Thank you

## 2014-07-04 NOTE — Telephone Encounter (Signed)
Noted, patient is aware. 

## 2014-07-04 NOTE — Telephone Encounter (Signed)
The only new medications we have given her are Invokana and glimepiride and these do not cause stomach side effects. Needs to discuss with PCP

## 2014-08-09 ENCOUNTER — Other Ambulatory Visit: Payer: Self-pay | Admitting: *Deleted

## 2014-08-09 ENCOUNTER — Telehealth: Payer: Self-pay | Admitting: Endocrinology

## 2014-08-09 DIAGNOSIS — IMO0001 Reserved for inherently not codable concepts without codable children: Secondary | ICD-10-CM

## 2014-08-09 DIAGNOSIS — E1165 Type 2 diabetes mellitus with hyperglycemia: Principal | ICD-10-CM

## 2014-08-09 MED ORDER — PIOGLITAZONE HCL 15 MG PO TABS: 15.0000 mg | ORAL_TABLET | Freq: Every day | ORAL | Status: DC

## 2014-08-09 NOTE — Telephone Encounter (Signed)
Patient is coming to pick up her lab order because she get her labs drawn at [facility].

## 2014-08-09 NOTE — Telephone Encounter (Signed)
Labs printed and at front desk for pick up

## 2014-08-10 ENCOUNTER — Ambulatory Visit: Payer: 59 | Admitting: Endocrinology

## 2014-08-30 LAB — LIPID PANEL: LDL CALC: 98 mg/dL

## 2014-09-01 ENCOUNTER — Other Ambulatory Visit: Payer: Self-pay | Admitting: Endocrinology

## 2014-09-05 ENCOUNTER — Ambulatory Visit (INDEPENDENT_AMBULATORY_CARE_PROVIDER_SITE_OTHER): Payer: 59 | Admitting: Endocrinology

## 2014-09-05 ENCOUNTER — Other Ambulatory Visit: Payer: Self-pay | Admitting: *Deleted

## 2014-09-05 ENCOUNTER — Encounter: Payer: Self-pay | Admitting: Endocrinology

## 2014-09-05 VITALS — BP 137/73 | HR 88 | Temp 98.5°F | Resp 16 | Ht 65.0 in | Wt 195.0 lb

## 2014-09-05 DIAGNOSIS — I10 Essential (primary) hypertension: Secondary | ICD-10-CM

## 2014-09-05 DIAGNOSIS — E1165 Type 2 diabetes mellitus with hyperglycemia: Secondary | ICD-10-CM

## 2014-09-05 DIAGNOSIS — E119 Type 2 diabetes mellitus without complications: Secondary | ICD-10-CM

## 2014-09-05 DIAGNOSIS — IMO0002 Reserved for concepts with insufficient information to code with codable children: Secondary | ICD-10-CM

## 2014-09-05 DIAGNOSIS — E785 Hyperlipidemia, unspecified: Secondary | ICD-10-CM

## 2014-09-05 MED ORDER — PIOGLITAZONE HCL 15 MG PO TABS: 15.0000 mg | ORAL_TABLET | Freq: Every day | ORAL | Status: DC

## 2014-09-05 NOTE — Progress Notes (Signed)
Patient ID: Sharon Montgomery, female   DOB: [birth year], 57 y.o.   MRN: [id number]    Reason for Appointment: Followup for Type 2 Diabetes  Referring physician: Moreen Fowler  History of Present Illness:          Diagnosis: Type 2 diabetes mellitus, date of diagnosis: 2005       Past history:    She was diagnosed to have diabetes about 10 years ago when she complained about blurred vision and increased thirst Her glucose was over 300 at bedtime and her weight was about 245 pounds   She thinks she was treated with Avandia initially and she took this for about  8 years   At some point she was also given metformin which was continued   Because of worsening control  she was given Onglyza in addition to her metformin and she had been taking Kombiglyze for the last 1-2 years   Also pioglitazone  15 mg was started in early 2015  to help with control  Recent history:   Prior to her referral she had a A1c of 10.5% on 05/13/14  Because of significantly high glucose readings she was started on a combination of Invokana and metformin along with continuing her low-dose Actos. Also 2 mg Amaryl was added in the evening She did have an episode of candidiasis treated with Diflucan initially but no recurrence Her blood sugars improved significantly with adding Invokana and she also started losing weight At the same time she started a Weight Watchers program She was told to stop Amaryl on her last visit in 7/15 No recent A1c is available Today she did not bring her blood sugar monitor but she claims that all her sugars are normal including after meals       Oral hypoglycemic drugs the patient is taking are:  Actos, Invokamet    Glucose monitoring results by recall:  Am 80-99 pc 135 or less   Glycemic control:   A1c of 10.5% on 05/13/14   Lab Results  Component Value Date   HGBA1C 8.6 06/04/2011   Lab Results  Component Value Date   MICROALBUR 1.96* 11/09/2012   CREATININE 0.71 10/22/2012     Self-care: The diet that the patient has been following is: tries to limit carbohydrates significantly.   She continues on Weight Watchers with 26 points. Generally has cereal for breakfast , eating out about 2-3 times a week , snacks are mostly fruits. Usually having  Diet drinks    Meals: 3 meals per day.          Exercise: 5/7 days a week for about 20 minutes, mostly walking         Dietician visit: Most recent:2013.                 Weight history: previous range 199-245  Wt Readings from Last 3 Encounters:  09/05/14 195 lb (88.451 kg)  06/27/14 207 lb 12.8 oz (94.257 kg)  06/02/14 219 lb 12.8 oz (99.701 kg)      Medication List       This list is accurate as of: 09/05/14  4:38 PM.  Always use your most recent med list.               aspirin 81 MG tablet  Take 81 mg by mouth daily.     CLARITIN-D 12 HOUR PO  Take 1 tablet by mouth daily.  ergocalciferol 50000 UNITS capsule  Commonly known as:  VITAMIN D2  Take 1 capsule (50,000 Units total) by mouth once a week.     freestyle lancets  Use as instructed to check blood sugar one time per day dx code 250.00     glucose blood test strip  Commonly known as:  FREESTYLE LITE  Use as instructed to check blood sugar one time per day dx code 250.00     INVOKAMET 50-1000 MG Tabs  Generic drug:  Canagliflozin-Metformin HCl  TAKE 1 TABLET BY MOUTH TWICE A DAY WITH FOOD     LIPITOR 40 MG tablet  Generic drug:  atorvastatin  Take 20 mg by mouth daily.     losartan 25 MG tablet  Commonly known as:  COZAAR  Take 25 mg by mouth daily.     pioglitazone 15 MG tablet  Commonly known as:  ACTOS  Take 1 tablet (15 mg total) by mouth daily.        Allergies:  Allergies  Allergen Reactions  . Lisinopril Cough    Past Medical History  Diagnosis Date  . Dyslipidemia   . Vitamin D deficiency   . History of breast cancer 2000  . Diabetes mellitus     TYPE 2  . Cancer     BREAST  . Hypertension     controlled  with diet  . Colon polyp 2008    adenomatous, colonoscopy every 5 years  . Hyperlipidemia   . Obesity     Past Surgical History  Procedure Laterality Date  . Colonoscopy  2008  . Cesarean section    . Tonsillectomy    . Shoulder surgery      PIN PLACED IN RIGHT SHOULDER  . Mastectomy      LEFT SIDE    Family History  Problem Relation Age of Onset  . Hypertension Father   . Diabetes Mother   . Diabetes Maternal Grandmother     Social History:  reports that she has never smoked. She has never used smokeless tobacco. She reports that she does not drink alcohol or use illicit drugs.    Review of Systems       Lipids:  She has been treated with Lipitor, her LDL in 6/15 was 204 LDL recently done by PCP       No results found for this basename: CHOL,  HDL,  LDLCALC,  LDLDIRECT,  TRIG,  CHOLHDL   Mild hypertension treated with only 25 mg Cozaar  She is getting constipation and she thinks this is better when she adds, 100 to her diet  Physical Examination:  BP 137/73  Pulse 88  Temp(Src) 98.5 F (36.9 C)  Resp 16  Ht 5\' 5"  (1.651 m)  Wt 195 lb (88.451 kg)  BMI 32.45 kg/m2  SpO2 97%   Exam not indicated    ASSESSMENT:  Diabetes type 2, uncontrolled     Her blood sugars are significantly better with adding Invokana to her regimen of metformin and Actos She continues to lose a significant amount of weight especially with inconsistent with her diet and exercise She did not bring her monitor for download but she thinks all her sugars are normal  PLAN:  She will have an A1c checked Meanwhile continue same medications Advised her that since she has good control and continued weight loss she can have at least one carbohydrate with each meal  Traven Davids 09/05/2014, 4:38 PM   Note: This office note was prepared with [employer]  voice recognition system technology. Any transcriptional errors that result from this process are unintentional.

## 2014-09-05 NOTE — Patient Instructions (Signed)
Please check blood sugars at least half the time about 2 hours after any meal and times per week on waking up. Please bring blood sugar monitor to each visit  More balanced meals

## 2014-09-12 ENCOUNTER — Other Ambulatory Visit: Payer: Self-pay | Admitting: Endocrinology

## 2014-09-13 LAB — COMPREHENSIVE METABOLIC PANEL
A/G RATIO: 1.9 (ref 1.1–2.5)
ALT: 15 IU/L (ref 0–32)
AST: 19 IU/L (ref 0–40)
Albumin: 4.3 g/dL (ref 3.5–5.5)
Alkaline Phosphatase: 88 IU/L (ref 39–117)
BILIRUBIN TOTAL: 0.6 mg/dL (ref 0.0–1.2)
BUN/Creatinine Ratio: 23 (ref 9–23)
BUN: 17 mg/dL (ref 6–24)
CALCIUM: 10.2 mg/dL (ref 8.7–10.2)
CO2: 25 mmol/L (ref 18–29)
CREATININE: 0.74 mg/dL (ref 0.57–1.00)
Chloride: 99 mmol/L (ref 97–108)
GFR, EST AFRICAN AMERICAN: 104 mL/min/{1.73_m2} (ref >59)
GFR, EST NON AFRICAN AMERICAN: 90 mL/min/{1.73_m2} (ref >59)
GLOBULIN, TOTAL: 2.3 g/dL (ref 1.5–4.5)
Glucose: 92 mg/dL (ref 65–99)
POTASSIUM: 5 mmol/L (ref 3.5–5.2)
Sodium: 141 mmol/L (ref 134–144)
TOTAL PROTEIN: 6.6 g/dL (ref 6.0–8.5)

## 2014-09-13 LAB — HGB A1C W/O EAG: Hgb A1c MFr Bld: 6.5 % — ABNORMAL HIGH (ref 4.8–5.6)

## 2014-09-16 ENCOUNTER — Ambulatory Visit (HOSPITAL_COMMUNITY): Payer: 59

## 2014-09-16 ENCOUNTER — Ambulatory Visit (HOSPITAL_COMMUNITY)
Admission: RE | Admit: 2014-09-16 | Discharge: 2014-09-16 | Disposition: A | Discharge status: Home / Self Care | Payer: 59 | Source: Ambulatory Visit | Attending: Obstetrics and Gynecology | Admitting: Obstetrics and Gynecology

## 2014-09-16 DIAGNOSIS — Z1231 Encounter for screening mammogram for malignant neoplasm of breast: Secondary | ICD-10-CM | POA: Diagnosis not present

## 2014-09-16 NOTE — Progress Notes (Signed)
Quick Note:  Please let patient know that the A1c result is good at 6.5. She does need appointment for a three-month followup ______

## 2014-09-23 ENCOUNTER — Telehealth: Payer: Self-pay | Admitting: Endocrinology

## 2014-09-23 ENCOUNTER — Other Ambulatory Visit: Payer: Self-pay | Admitting: *Deleted

## 2014-09-23 MED ORDER — FLUCONAZOLE 150 MG PO TABS: 150.0000 mg | ORAL_TABLET | Freq: Once | ORAL | Status: DC

## 2014-09-23 NOTE — Telephone Encounter (Signed)
She will be prescribed Diflucan 150 mg, 1 refill

## 2014-09-23 NOTE — Telephone Encounter (Signed)
Should Patient contact her PCP?

## 2014-09-23 NOTE — Telephone Encounter (Signed)
Pt has a yeast infection please call in for the pt some cream or pill to help with this  Please call pt on [phone] phone number to let her know when or if this will be completed

## 2014-10-03 ENCOUNTER — Encounter: Payer: Self-pay | Admitting: Endocrinology

## 2014-10-07 ENCOUNTER — Other Ambulatory Visit: Payer: Self-pay | Admitting: Endocrinology

## 2014-10-07 ENCOUNTER — Telehealth: Payer: Self-pay | Admitting: Endocrinology

## 2014-10-07 NOTE — Telephone Encounter (Signed)
Patient stated that the pharmacy did not receive the renewal prescription for Invokanmet 50-1000 mg

## 2014-10-07 NOTE — Telephone Encounter (Signed)
rx was verbally called in to patients pharmacy

## 2014-12-09 ENCOUNTER — Ambulatory Visit: Payer: 59 | Admitting: Endocrinology

## 2014-12-23 ENCOUNTER — Ambulatory Visit: Payer: Self-pay | Admitting: Endocrinology

## 2015-01-08 ENCOUNTER — Other Ambulatory Visit: Payer: Self-pay | Admitting: Endocrinology

## 2015-02-10 ENCOUNTER — Other Ambulatory Visit: Payer: Self-pay | Admitting: *Deleted

## 2015-02-10 ENCOUNTER — Telehealth: Payer: Self-pay | Admitting: Endocrinology

## 2015-02-10 MED ORDER — CANAGLIFLOZIN-METFORMIN HCL 50-1000 MG PO TABS: ORAL_TABLET | ORAL | Status: DC

## 2015-02-10 NOTE — Telephone Encounter (Signed)
Denied, patient is past due for office visit. Unable to refill until she's seen in the office

## 2015-02-10 NOTE — Telephone Encounter (Signed)
Patient need refill of Invokamet 50-1000 mg 90 day supply. [facility] [address]

## 2015-02-10 NOTE — Telephone Encounter (Signed)
rx sent for 30 day supply until patient is seen on 4/1

## 2015-02-24 ENCOUNTER — Other Ambulatory Visit: Payer: Self-pay | Admitting: Endocrinology

## 2015-02-25 LAB — COMPREHENSIVE METABOLIC PANEL
A/G RATIO: 1.6 (ref 1.1–2.5)
ALK PHOS: 76 IU/L (ref 39–117)
ALT: 13 IU/L (ref 0–32)
AST: 16 IU/L (ref 0–40)
Albumin: 4 g/dL (ref 3.5–5.5)
BILIRUBIN TOTAL: 0.5 mg/dL (ref 0.0–1.2)
BUN / CREAT RATIO: 27 — AB (ref 9–23)
BUN: 20 mg/dL (ref 6–24)
CHLORIDE: 99 mmol/L (ref 97–108)
CO2: 23 mmol/L (ref 18–29)
Calcium: 9.5 mg/dL (ref 8.7–10.2)
Creatinine, Ser: 0.74 mg/dL (ref 0.57–1.00)
GFR, EST AFRICAN AMERICAN: 103 mL/min/{1.73_m2} (ref >59)
GFR, EST NON AFRICAN AMERICAN: 90 mL/min/{1.73_m2} (ref >59)
Globulin, Total: 2.5 g/dL (ref 1.5–4.5)
Glucose: 86 mg/dL (ref 65–99)
POTASSIUM: 4 mmol/L (ref 3.5–5.2)
SODIUM: 138 mmol/L (ref 134–144)
Total Protein: 6.5 g/dL (ref 6.0–8.5)

## 2015-02-25 LAB — HEMOGLOBIN A1C
Est. average glucose Bld gHb Est-mCnc: 140 mg/dL
HEMOGLOBIN A1C: 6.5 % — AB (ref 4.8–5.6)

## 2015-03-03 ENCOUNTER — Encounter: Payer: Self-pay | Admitting: Endocrinology

## 2015-03-03 ENCOUNTER — Ambulatory Visit (INDEPENDENT_AMBULATORY_CARE_PROVIDER_SITE_OTHER): Payer: Self-pay | Admitting: Endocrinology

## 2015-03-03 VITALS — BP 158/86 | HR 96 | Temp 98.3°F | Resp 14 | Ht 65.0 in | Wt 210.0 lb

## 2015-03-03 DIAGNOSIS — I10 Essential (primary) hypertension: Secondary | ICD-10-CM

## 2015-03-03 DIAGNOSIS — E119 Type 2 diabetes mellitus without complications: Secondary | ICD-10-CM

## 2015-03-03 NOTE — Progress Notes (Signed)
Patient ID: Sharon Montgomery, female   DOB: 07/21/1956, 59 y.o.   MRN: [id number]    Reason for Appointment: Followup for Type 2 Diabetes  Referring physician: Moreen Fowler  History of Present Illness:          Diagnosis: Type 2 diabetes mellitus, date of diagnosis: 2005       Past history:    She was diagnosed to have diabetes about 10 years ago when she complained about blurred vision and increased thirst Her glucose was over 300 at bedtime and her weight was about 245 pounds   She thinks she was treated with Avandia initially and she took this for about  8 years   At some point she was also given metformin which was continued   Because of worsening control  she was given Onglyza in addition to her metformin and she had been taking Kombiglyze for the last 1-2 years   Also pioglitazone  15 mg was started in early 2015  to help with control  Prior to her referral she had a A1c of 10.5% on 05/13/14   Recent history:  She again has not brought her blood sugar monitor for review On her initial consultation she did well with starting on a combination of Invokana and metformin along with continuing her low-dose Actos.  Subsequently 2 mg Amaryl was added in the evening but later discontinued in 7/15 Her blood sugars improved significantly with adding Invokana and this helped with weight loss also and no recent side effects However her weight is increased very significantly since her last visit probably from going off her Weight Watchers diet She has not been watching her carbohydrates and fats recently also She was doing well with exercise program and has not done any However her A1c is still stable at 6.5 and her home blood sugars are looking fairly good       Oral hypoglycemic drugs the patient is taking are:  Actos, Invokamet    Glucose monitoring results by recall:  Am 85-99 pc 120-135    Glycemic control:   A1c of 10.5% on 05/13/14   Lab Results  Component Value Date   HGBA1C 6.5* 02/24/2015   HGBA1C 6.5* 09/12/2014   HGBA1C 8.6 06/04/2011   Lab Results  Component Value Date   MICROALBUR 1.96* 11/09/2012   LDLCALC 98 08/30/2014   CREATININE 0.74 02/24/2015    Self-care: The diet that the patient has been following is: tries to limit carbohydrates significantly.   She has just started Weight Watchers diet with 26 points.  Generally has cereal for breakfast , eating out about 2-3 times a week , snacks are mostly fruits. Usually having  Diet drinks    Exercise: none for some time; was doing 5/7 days a week for about 20 minutes, mostly walking         Dietician visit: Most recent:2013.                 Weight history: previous range 199-245  Wt Readings from Last 3 Encounters:  03/03/15 210 lb (95.255 kg)  09/05/14 195 lb (88.451 kg)  06/27/14 207 lb 12.8 oz (94.257 kg)      Medication List       This list is accurate as of: 03/03/15  3:59 PM.  Always use your most recent med list.               aspirin 81  MG tablet  Take 81 mg by mouth daily.     Canagliflozin-Metformin HCl 50-1000 MG Tabs  Commonly known as:  INVOKAMET  TAKE 1 TABLET BY MOUTH 2 TIMES A DAY WITH FOOD     CLARITIN-D 12 HOUR PO  Take 1 tablet by mouth daily.     ergocalciferol 50000 UNITS capsule  Commonly known as:  VITAMIN D2  Take 1 capsule (50,000 Units total) by mouth once a week.     fluconazole 150 MG tablet  Commonly known as:  DIFLUCAN  Take 1 tablet (150 mg total) by mouth once.     freestyle lancets  Use as instructed to check blood sugar one time per day dx code 250.00     glucose blood test strip  Commonly known as:  FREESTYLE LITE  Use as instructed to check blood sugar one time per day dx code 250.00     LIPITOR 40 MG tablet  Generic drug:  atorvastatin  Take 20 mg by mouth daily.     atorvastatin 20 MG tablet  Commonly known as:  LIPITOR     losartan 25 MG tablet  Commonly known as:  COZAAR  Take 25 mg by mouth daily.     pioglitazone  15 MG tablet  Commonly known as:  ACTOS  Take 1 tablet (15 mg total) by mouth daily.        Allergies:  Allergies  Allergen Reactions  . Lisinopril Cough    Past Medical History  Diagnosis Date  . Dyslipidemia   . Vitamin D deficiency   . History of breast cancer 2000  . Diabetes mellitus     TYPE 2  . Cancer     BREAST  . Hypertension     controlled with diet  . Colon polyp 2008    adenomatous, colonoscopy every 5 years  . Hyperlipidemia   . Obesity     Past Surgical History  Procedure Laterality Date  . Colonoscopy  2008  . Cesarean section    . Tonsillectomy    . Shoulder surgery      PIN PLACED IN RIGHT SHOULDER  . Mastectomy      LEFT SIDE    Family History  Problem Relation Age of Onset  . Hypertension Father   . Diabetes Mother   . Diabetes Maternal Grandmother     Social History:  reports that she has never smoked. She has never used smokeless tobacco. She reports that she does not drink alcohol or use illicit drugs.    Review of Systems       Lipids:  She has been treated with Lipitor, her LDL in 6/15 was 204 LDL  done by PCP:      Lab Results  Component Value Date   LDLCALC 98 08/30/2014    Mild hypertension treated with only 25 mg Cozaar and blood pressure is significantly high today Has not taken any decongestants recently  Physical Examination:  BP 158/86 mmHg  Pulse 96  Temp(Src) 98.3 F (36.8 C)  Resp 14  Ht 5\' 5"  (1.651 m)  Wt 210 lb (95.255 kg)  BMI 34.95 kg/m2  SpO2 96%    ASSESSMENT:  Diabetes type 2,   Her blood sugars are consistently better with adding Invokana to her regimen of metformin and Actos Although this had initially helped her weight loss she is trending back higher on her weight with her going off her diet and exercise regimen as discussed in history of present illness She  did not bring her monitor for download but she believes her blood sugars are fairly close to target even after meals Lab glucose  was also reasonably good A1c is stable at 6.5  HYPERTENSION: She clearly has significant hypertension and will need to be on at least 50 mg of losartan, may also do better with losing weight and exercising  PLAN:  She needs to significantly improve her diet and exercise regimen Meanwhile continue same medications To bring her monitor on the next visit  Increase losartan 50 mg until seen by PCP   Patient Instructions  2 Losartan daily     Laurence Folz 03/03/2015, 3:59 PM   Note: This office note was prepared with Dragon voice recognition system technology. Any transcriptional errors that result from this process are unintentional.

## 2015-03-03 NOTE — Patient Instructions (Signed)
2 Losartan daily

## 2015-03-13 ENCOUNTER — Other Ambulatory Visit: Payer: Self-pay | Admitting: *Deleted

## 2015-03-13 ENCOUNTER — Other Ambulatory Visit: Payer: Self-pay | Admitting: Endocrinology

## 2015-03-13 MED ORDER — CANAGLIFLOZIN-METFORMIN HCL 50-1000 MG PO TABS: ORAL_TABLET | ORAL | Status: DC

## 2015-03-13 MED ORDER — PIOGLITAZONE HCL 15 MG PO TABS: 15.0000 mg | ORAL_TABLET | Freq: Every day | ORAL | Status: DC

## 2015-03-13 NOTE — Telephone Encounter (Signed)
Done

## 2015-03-13 NOTE — Telephone Encounter (Signed)
Patient need refill of invokamet 50-1000 mg, sent to [facility] pharmacy,  And a refill of Pioglitazone sent to [facility]

## 2015-03-13 NOTE — Addendum Note (Signed)
Addended by: Rockie Neighbours B on: 03/13/2015 04:39 PM   Modules accepted: Orders

## 2015-05-20 ENCOUNTER — Other Ambulatory Visit: Payer: Self-pay | Admitting: Endocrinology

## 2015-05-22 ENCOUNTER — Telehealth: Payer: Self-pay | Admitting: Endocrinology

## 2015-05-22 NOTE — Telephone Encounter (Signed)
Patient would like a refill on her medication   Rx: Invokamet   Pharmacy: [facility]

## 2015-05-22 NOTE — Telephone Encounter (Signed)
rx was sent in this morning. 

## 2015-06-16 ENCOUNTER — Other Ambulatory Visit: Payer: Self-pay

## 2015-06-16 ENCOUNTER — Other Ambulatory Visit: Payer: Self-pay | Admitting: *Deleted

## 2015-06-16 DIAGNOSIS — IMO0002 Reserved for concepts with insufficient information to code with codable children: Secondary | ICD-10-CM

## 2015-06-16 DIAGNOSIS — E1165 Type 2 diabetes mellitus with hyperglycemia: Secondary | ICD-10-CM

## 2015-06-23 ENCOUNTER — Telehealth: Payer: Self-pay | Admitting: Endocrinology

## 2015-06-23 NOTE — Telephone Encounter (Signed)
Pt needs the orders for the labs she does not know what draw center she is going to so she is coming to pick them up

## 2015-06-28 LAB — COMPREHENSIVE METABOLIC PANEL
ALT: 15 IU/L (ref 0–32)
AST: 19 IU/L (ref 0–40)
Albumin/Globulin Ratio: 1.7 (ref 1.1–2.5)
Albumin: 4.1 g/dL (ref 3.5–5.5)
Alkaline Phosphatase: 81 IU/L (ref 39–117)
BUN / CREAT RATIO: 24 — AB (ref 9–23)
BUN: 18 mg/dL (ref 6–24)
Bilirubin Total: 0.5 mg/dL (ref 0.0–1.2)
CALCIUM: 9.5 mg/dL (ref 8.7–10.2)
CO2: 24 mmol/L (ref 18–29)
CREATININE: 0.74 mg/dL (ref 0.57–1.00)
Chloride: 99 mmol/L (ref 97–108)
GFR calc Af Amer: 103 mL/min/{1.73_m2} (ref >59)
GFR calc non Af Amer: 90 mL/min/{1.73_m2} (ref >59)
Globulin, Total: 2.4 g/dL (ref 1.5–4.5)
Glucose: 90 mg/dL (ref 65–99)
POTASSIUM: 4.1 mmol/L (ref 3.5–5.2)
SODIUM: 140 mmol/L (ref 134–144)
Total Protein: 6.5 g/dL (ref 6.0–8.5)

## 2015-06-28 LAB — HEMOGLOBIN A1C
ESTIMATED AVERAGE GLUCOSE: 137 mg/dL
HEMOGLOBIN A1C: 6.4 % — AB (ref 4.8–5.6)

## 2015-07-07 ENCOUNTER — Encounter: Payer: Self-pay | Admitting: Endocrinology

## 2015-07-07 ENCOUNTER — Ambulatory Visit (INDEPENDENT_AMBULATORY_CARE_PROVIDER_SITE_OTHER): Payer: 59 | Admitting: Endocrinology

## 2015-07-07 VITALS — BP 122/78 | HR 80 | Temp 97.8°F | Resp 16 | Ht 65.0 in | Wt 210.2 lb

## 2015-07-07 DIAGNOSIS — E119 Type 2 diabetes mellitus without complications: Secondary | ICD-10-CM

## 2015-07-07 NOTE — Progress Notes (Signed)
Patient ID: Sharon Montgomery, female   DOB: 05-06-1956, 59 y.o.   MRN: [id number]    Reason for Appointment: Followup for Type 2 Diabetes  Referring physician: Moreen Fowler  History of Present Illness:          Diagnosis: Type 2 diabetes mellitus, date of diagnosis: 2005       Past history:    She was diagnosed to have diabetes about 10 years ago when she complained about blurred vision and increased thirst Her glucose was over 300 at bedtime and her weight was about 245 pounds   She thinks she was treated with Avandia initially and she took this for about  8 years   At some point she was also given metformin which was continued   Because of worsening control  she was given Onglyza in addition to her metformin and she had been taking Kombiglyze for the last 1-2 years   Also pioglitazone  15 mg was started in early 2015  to help with control  Prior to her referral she had a A1c of 10.5% on 05/13/14  On her initial consultation she did well with starting on a combination of Invokana and metformin along with continuing her low-dose Actos.  Subsequently 2 mg Amaryl was added in the evening but later discontinued in 7/15  Recent history:   Oral hypoglycemic drugs the patient is taking are:  Actos 15 mg, Invokamet 50/1000 twice a day  Her blood sugars improved significantly with adding Invokana previously and this helped with weight loss also  She is tolerating all her medications well Although her blood sugars are looking fairly good overall she is still not able to lose weight She has only recently started an exercise program She goes off and on her diet and recently going back on Weight Watchers She again has not brought her blood sugar monitor for review but she reports fairly good readings at different times; she does not appear to remember her readings well and she thinks her blood sugars are higher in the mornings and after meals However her A1c is still stable at 6.4        Glucose monitoring results by recall:  Am 100-140      pc 120-135    Glycemic control:    Lab Results  Component Value Date   HGBA1C 6.4* 06/27/2015   HGBA1C 6.5* 02/24/2015   HGBA1C 6.5* 09/12/2014   Lab Results  Component Value Date   MICROALBUR 1.96* 11/09/2012   LDLCALC 98 08/30/2014   CREATININE 0.74 06/27/2015    Self-care: The diet that the patient has been following is: tries to limit carbohydrates significantly.   She has again started Weight Watchers diet with 26 points.  Generally has cereal for breakfast , eating out about 2-3 times a week , snacks are mostly fruits. Usually having  Diet drinks    Exercise:  for the last 2 weeks doing 5/7 days a week for about 20 minutes, mostly walking         Dietician visit: Most recent: 2013.                 Weight history: previous range 199-245  Wt Readings from Last 3 Encounters:  07/07/15 210 lb 3.2 oz (95.346 kg)  03/03/15 210 lb (95.255 kg)  09/05/14 195 lb (88.451 kg)      Medication List       This list is  accurate as of: 07/07/15 11:59 PM.  Always use your most recent med list.               aspirin 81 MG tablet  Take 81 mg by mouth daily.     CLARITIN-D 12 HOUR PO  Take 1 tablet by mouth daily.     ergocalciferol 50000 UNITS capsule  Commonly known as:  VITAMIN D2  Take 1 capsule (50,000 Units total) by mouth once a week.     freestyle lancets  Use as instructed to check blood sugar one time per day dx code 250.00     glucose blood test strip  Commonly known as:  FREESTYLE LITE  Use as instructed to check blood sugar one time per day dx code 250.00     INVOKAMET 50-1000 MG Tabs  Generic drug:  Canagliflozin-Metformin HCl  TAKE 1 TABLET BY MOUTH 2 TIMES A DAY WITH FOOD     LIPITOR 40 MG tablet  Generic drug:  atorvastatin  Take 20 mg by mouth daily.     atorvastatin 20 MG tablet  Commonly known as:  LIPITOR     losartan 25 MG tablet  Commonly known as:  COZAAR  Take 25 mg by mouth  daily.     pioglitazone 15 MG tablet  Commonly known as:  ACTOS  Take 1 tablet (15 mg total) by mouth daily.        Allergies:  Allergies  Allergen Reactions  . Lisinopril Cough    Past Medical History  Diagnosis Date  . Dyslipidemia   . Vitamin D deficiency   . History of breast cancer 2000  . Diabetes mellitus     TYPE 2  . Cancer     BREAST  . Hypertension     controlled with diet  . Colon polyp 2008    adenomatous, colonoscopy every 5 years  . Hyperlipidemia   . Obesity     Past Surgical History  Procedure Laterality Date  . Colonoscopy  2008  . Cesarean section    . Tonsillectomy    . Shoulder surgery      PIN PLACED IN RIGHT SHOULDER  . Mastectomy      LEFT SIDE    Family History  Problem Relation Age of Onset  . Hypertension Father   . Diabetes Mother   . Diabetes Maternal Grandmother     Social History:  reports that she has never smoked. She has never used smokeless tobacco. She reports that she does not drink alcohol or use illicit drugs.    Review of Systems       Lipids:  She has been treated with Lipitor, her LDL in 6/15 was 204 LDL  done by PCP:      Lab Results  Component Value Date   LDLCALC 98 08/30/2014    Mild hypertension treated with only 25 mg Cozaar and blood pressure is fairly good today, does have mild white coat syndrome on initial measurement    Physical Examination:  BP 122/78 mmHg  Pulse 80  Temp(Src) 97.8 F (36.6 C)  Resp 16  Ht 5\' 5"  (1.651 m)  Wt 210 lb 3.2 oz (95.346 kg)  BMI 34.98 kg/m2  SpO2 98%   No ankle edema  ASSESSMENT:  Diabetes type 2,   Her blood sugars are consistently controlled now with her regimen of  Invokana along with metformin and Actos 15 mg  She did not bring her monitor for download but she believes her blood  sugars are fairly close to target even after meals Lab glucose was also reasonably good A1c is stable at 6.4 She tends to be inconsistent with her regimen of diet and  exercise and only recently is starting back on both  HYPERTENSION:  blood pressure is relatively better today after second measurement Consider increasing losartan if she has any consistent increase in blood pressure or microalbuminuria  PLAN:  She needs to be consistent with  her diet and exercise regimen Meanwhile continue same medications To bring her monitor on the next visit  Continue follow-up with PCP for hyperlipidemia    Patient Instructions  Check blood sugars on waking up .Marland Kitchen 2-3 .Marland Kitchen times a week Also check blood sugars about 2 hours after a meal and do this after different meals by rotation  Recommended blood sugar levels on waking up is 90-130 and about 2 hours after meal is 140-180 Please bring blood sugar monitor to each visit.      Conni Knighton 07/09/2015, 11:12 AM   Note: This office note was prepared with Estate agent. Any transcriptional errors that result from this process are unintentional.

## 2015-07-07 NOTE — Patient Instructions (Signed)
Check blood sugars on waking up .. 2-3 .. times a week  Also check blood sugars about 2 hours after a meal and do this after different meals by rotation  Recommended blood sugar levels on waking up is 90-130 and about 2 hours after meal is 140-180 Please bring blood sugar monitor to each visit.  

## 2015-08-09 ENCOUNTER — Other Ambulatory Visit: Payer: Self-pay

## 2015-08-09 DIAGNOSIS — Z1231 Encounter for screening mammogram for malignant neoplasm of breast: Secondary | ICD-10-CM

## 2015-08-11 ENCOUNTER — Other Ambulatory Visit: Payer: Self-pay | Admitting: Endocrinology

## 2015-09-06 ENCOUNTER — Telehealth: Payer: Self-pay | Admitting: Endocrinology

## 2015-09-06 NOTE — Telephone Encounter (Signed)
Please fax the wellness screening appeal form one page from her job to # [phone] Please call pt before you send

## 2015-09-19 ENCOUNTER — Ambulatory Visit
Admission: RE | Admit: 2015-09-19 | Discharge: 2015-09-19 | Disposition: A | Discharge status: Home / Self Care | Payer: 59 | Source: Ambulatory Visit

## 2015-09-19 DIAGNOSIS — Z1231 Encounter for screening mammogram for malignant neoplasm of breast: Secondary | ICD-10-CM

## 2015-10-01 ENCOUNTER — Other Ambulatory Visit: Payer: Self-pay | Admitting: Endocrinology

## 2015-10-05 ENCOUNTER — Telehealth: Payer: Self-pay | Admitting: Endocrinology

## 2015-10-05 ENCOUNTER — Other Ambulatory Visit: Payer: Self-pay | Admitting: *Deleted

## 2015-10-05 MED ORDER — CANAGLIFLOZIN-METFORMIN HCL 50-1000 MG PO TABS: ORAL_TABLET | ORAL | Status: DC

## 2015-10-05 NOTE — Telephone Encounter (Signed)
rx sent to [facility] 

## 2015-10-05 NOTE — Telephone Encounter (Signed)
Patient called stating that she would like her Rx sent to her pharmacy   Rx: [place]: [facility]    Thank you

## 2015-11-10 ENCOUNTER — Ambulatory Visit
Admission: RE | Admit: 2015-11-10 | Discharge: 2015-11-10 | Disposition: A | Discharge status: Home / Self Care | Payer: 59 | Source: Ambulatory Visit | Attending: Obstetrics and Gynecology | Admitting: Obstetrics and Gynecology

## 2015-11-10 ENCOUNTER — Other Ambulatory Visit: Payer: Self-pay | Admitting: Obstetrics and Gynecology

## 2015-11-10 DIAGNOSIS — M47814 Spondylosis without myelopathy or radiculopathy, thoracic region: Secondary | ICD-10-CM

## 2015-11-10 DIAGNOSIS — M810 Age-related osteoporosis without current pathological fracture: Secondary | ICD-10-CM

## 2015-11-10 HISTORY — DX: Spondylosis without myelopathy or radiculopathy, thoracic region: M47.814

## 2016-01-01 ENCOUNTER — Other Ambulatory Visit: Payer: Self-pay | Admitting: Endocrinology

## 2016-01-02 ENCOUNTER — Ambulatory Visit: Payer: 59 | Attending: Obstetrics and Gynecology

## 2016-01-02 DIAGNOSIS — M810 Age-related osteoporosis without current pathological fracture: Secondary | ICD-10-CM

## 2016-01-02 DIAGNOSIS — R531 Weakness: Secondary | ICD-10-CM | POA: Diagnosis not present

## 2016-01-02 DIAGNOSIS — M25551 Pain in right hip: Secondary | ICD-10-CM | POA: Diagnosis present

## 2016-01-02 NOTE — Therapy (Signed)
[place], [place], [address] Phone: [phone]   Fax:  [phone]  Physical Therapy Evaluation  Patient Details  Name: Sharon Montgomery MRN: [id number] Date of Birth: 09-17-1956 Referring Provider: Leo Grosser  Encounter Date: 01/02/2016      PT End of Session - 01/02/16 1530    Visit Number 1   Number of Visits 6   Date for PT Re-Evaluation 02/16/16   PT Start Time [id number]   PT Stop Time 1505   PT Time Calculation (min) 44 min   Activity Tolerance Patient tolerated treatment well   Behavior During Therapy [facility] for tasks assessed/performed      Past Medical History  Diagnosis Date  . Dyslipidemia   . Vitamin D deficiency   . History of breast cancer 2000  . Diabetes mellitus     TYPE 2  . Cancer     BREAST  . Hypertension     controlled with diet  . Colon polyp 2008    adenomatous, colonoscopy every 5 years  . Hyperlipidemia   . Obesity     Past Surgical History  Procedure Laterality Date  . Colonoscopy  2008  . Cesarean section    . Tonsillectomy    . Shoulder surgery      PIN PLACED IN RIGHT SHOULDER  . Mastectomy      LEFT SIDE    There were no vitals filed for this visit.  Visit Diagnosis:  Weakness  Hip pain, right  Osteoporosis      Subjective Assessment - 01/02/16 1427    Subjective Pt was referred by MD for "osteoporosis" and to develop a program for strengthening and prevention. Pt does report of R hip pain sometimes at night when sleeping.    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    How long can you sit comfortably? NA   How long can you stand comfortably? NA   How long can you walk comfortably? NA   Patient Stated Goals To develop HEP and be  indep on own.    Currently in Pain? No/denies            [facility] PT Assessment - 01/02/16 0001    Assessment   Medical Diagnosis osteoporosis   Referring Provider  Haygood   Onset Date/Surgical Date 11/17/15   Hand Dominance Right   Next MD Visit unknown   Prior Therapy none- calcium tx for osteoporosis   Precautions   Precautions None   Restrictions   Weight Bearing Restrictions No   Balance Screen   Has the patient fallen in the past 6 months No   [place] residence   Prior Function   Level of Independence Independent   Cognition   Overall Cognitive Status Within Functional Limits for tasks assessed   Observation/Other Assessments   Focus on Therapeutic Outcomes (FOTO)  not assessed   ROM / Strength   AROM / PROM / Strength Strength   Strength   Strength Assessment Site Shoulder;Hip;Knee;Ankle   Right/Left Shoulder Right;Left   Right Shoulder Flexion 4-/5   Right Shoulder ABduction 3+/5   Right Shoulder Internal Rotation 4/5   Right Shoulder External Rotation 4-/5   Left Shoulder Flexion 4/5   Left Shoulder ABduction 4-/5   Left Shoulder Internal Rotation 4/5   Left Shoulder External Rotation 4-/5   Right/Left Hip Right;Left   Right Hip Flexion 4-/5   Right  Hip Extension 4-/5   Right Hip ABduction 3-/5   Right Hip ADduction 4-/5   Left Hip Flexion 3+/5   Left Hip Extension 3+/5   Left Hip ABduction 3-/5   Left Hip ADduction 3+/5   Right/Left Knee Right;Left   Right Knee Flexion 4/5   Right Knee Extension 4/5   Left Knee Flexion 4/5   Left Knee Extension 4/5   Right/Left Ankle Right;Left   Right Ankle Dorsiflexion 4/5   Right Ankle Plantar Flexion 3+/5   Left Ankle Dorsiflexion 4/5   Left Ankle Plantar Flexion 3+/5   Transfers   Five time sit to stand comments  without hands from blue chair, only able to perform 3 times in 35 secs. Unable to perform 5 times.                    [place] Adult PT Treatment/Exercise - 01/02/16 0001    Exercises   Exercises Knee/Hip   Knee/Hip Exercises: Supine   Bridges 10 reps  HEP   Bridges Limitations VCs for breathing    Knee/Hip  Exercises: Sidelying   Hip ABduction 10 reps  HEP   Hip ABduction Limitations AA with R LE to achieve full range  required 5 x 2 sets on R LE due to weakness   Clams 10 x   HEP                PT Education - 01/02/16 1528    Education provided Yes   Education Details PT POC, HEP: Bridges, hip ABD SLR, clams   Person(s) Educated Patient   Methods Explanation;Demonstration;Tactile cues;Verbal cues;Handout   Comprehension Verbalized understanding;Need further instruction;Returned demonstration          PT Short Term Goals - 01/02/16 1543    PT SHORT TERM GOAL #1   Title STGs= LTGs           PT Long Term Goals - 01/02/16 1543    PT LONG TERM GOAL #1   Title Establish HEP for continued strengthening and transistion to communiy program by 01/30/16.    Time 4   Period Weeks   Status New   PT LONG TERM GOAL #2   Title Hip ABDuction improved from 3-/5 to 4-/5 in order to sleep without R hip pain at night by 02/09/16.   Time 6   Period Weeks   Status New   PT LONG TERM GOAL #3   Title Pt will perform sit to stand x 5 in 30 secs by 02/09/16.    Time 6   Period Weeks   Status New               Plan - 01/02/16 1533    Clinical Impression Statement Pt was referred by MD for "osteoporosis" and to develop a program for strengthening and prevention. Pt presents with significant weakness in bilat hips. Pt was unable to perform sit to stand x 5 without UE assist. Pt would benefit from PT once this week, 2 times a week for 2 weeks, and 1 time a week for 2 weeks to establish a HEP and progress to community program.    Pt will benefit from skilled therapeutic intervention in order to improve on the following deficits Decreased strength;Decreased activity tolerance;Obesity;Abnormal gait;Difficulty walking   Rehab Potential Excellent   PT Frequency 2x / week   PT Duration 6 weeks   PT Treatment/Interventions Therapeutic exercise;Therapeutic activities;Functional mobility  training;Stair training;Gait training;Neuromuscular re-education   PT Next Visit  Plan Review HEP, leg press, Progress HEP: wall squats, step ups, heel raises.    PT Home Exercise Plan Bridges, sidelying: hip ABD, clams    Consulted and Agree with Plan of Care Patient          G-Codes - January 13, 2016 1749    Functional Assessment Tool Used Clinical judgement, MMT strength measures   Functional Limitation Mobility: Walking and moving around   Mobility: Walking and Moving Around Current Status [id number]) At least 1 percent but less than 20 percent impaired, limited or restricted   Mobility: Walking and Moving Around Goal Status [id number]) 0 percent impaired, limited or restricted       Problem List Patient Active Problem List   Diagnosis Date Noted  . Unspecified vitamin D deficiency 11/09/2012  . Type II diabetes mellitus, uncontrolled ([place]) 10/09/2011  . Dyslipidemia 10/09/2011  . Obesity 10/09/2011  . Hyperlipidemia 06/04/2011  . Essential hypertension, benign 06/04/2011    Dollene Cleveland, PT 2016/01/13, 5:50 PM  [facility] [address] [place], [place], [address] Phone: [phone]   Fax:  [phone]  Name: Sharon Montgomery MRN: [id number] Date of Birth: Apr 15, 1956

## 2016-01-02 NOTE — Addendum Note (Signed)
Addended by: Dollene Cleveland on: 01/02/2016 06:43 PM   Modules accepted: Orders

## 2016-01-02 NOTE — Patient Instructions (Signed)
Bridge    Lie back, legs bent. Inhale, pressing hips up. Keeping ribs in, lengthen lower back. Exhale, rolling down along spine from top. Repeat _10___ times. Do __3__ sessions per day.  http://pm.exer.us/55   Copyright  VHI. All rights reserved.    Abduction: Clam (Eccentric) - Side-Lying    Lie on side with knees bent. Lift top knee, keeping feet together. Keep trunk steady. Slowly lower for 3-5 seconds. __10_ reps per set, __3_ sets per day, _7_ days per week.  http://ecce.exer.us/65   Copyright  VHI. All rights reserved.   Hip Abduction: Side-Lying (Single Leg)    Side-lying, tubing around ankles, raise top leg, keeping knee straight. Repeat _10_ times per set. Repeat with other leg. Do _3_ sets per session. Do _7_ sessions per week.  http://tub.exer.us/210   Copyright  VHI. All rights reserved.

## 2016-01-04 ENCOUNTER — Other Ambulatory Visit: Payer: Self-pay | Admitting: *Deleted

## 2016-01-04 DIAGNOSIS — E118 Type 2 diabetes mellitus with unspecified complications: Principal | ICD-10-CM

## 2016-01-04 DIAGNOSIS — E1165 Type 2 diabetes mellitus with hyperglycemia: Secondary | ICD-10-CM

## 2016-01-08 ENCOUNTER — Ambulatory Visit: Payer: 59

## 2016-01-09 ENCOUNTER — Other Ambulatory Visit: Payer: Self-pay | Admitting: Endocrinology

## 2016-01-10 ENCOUNTER — Ambulatory Visit: Payer: 59 | Attending: Obstetrics and Gynecology

## 2016-01-10 DIAGNOSIS — M810 Age-related osteoporosis without current pathological fracture: Secondary | ICD-10-CM | POA: Diagnosis present

## 2016-01-10 DIAGNOSIS — M25551 Pain in right hip: Secondary | ICD-10-CM

## 2016-01-10 DIAGNOSIS — R531 Weakness: Secondary | ICD-10-CM | POA: Insufficient documentation

## 2016-01-10 LAB — LIPID PANEL
Chol/HDL Ratio: 2.3 ratio units (ref 0.0–4.4)
Cholesterol, Total: 228 mg/dL — ABNORMAL HIGH (ref 100–199)
HDL: 101 mg/dL (ref >39)
LDL Calculated: 115 mg/dL — ABNORMAL HIGH (ref 0–99)
Triglycerides: 62 mg/dL (ref 0–149)
VLDL Cholesterol Cal: 12 mg/dL (ref 5–40)

## 2016-01-10 LAB — COMPREHENSIVE METABOLIC PANEL
ALT: 17 IU/L (ref 0–32)
AST: 21 IU/L (ref 0–40)
Albumin/Globulin Ratio: 1.5 (ref 1.1–2.5)
Albumin: 4.2 g/dL (ref 3.5–5.5)
Alkaline Phosphatase: 95 IU/L (ref 39–117)
BUN/Creatinine Ratio: 20 (ref 9–23)
BUN: 17 mg/dL (ref 6–24)
Bilirubin Total: 0.4 mg/dL (ref 0.0–1.2)
CO2: 25 mmol/L (ref 18–29)
Calcium: 10.1 mg/dL (ref 8.7–10.2)
Chloride: 97 mmol/L (ref 96–106)
Creatinine, Ser: 0.83 mg/dL (ref 0.57–1.00)
GFR calc Af Amer: 90 mL/min/{1.73_m2} (ref >59)
GFR calc non Af Amer: 78 mL/min/{1.73_m2} (ref >59)
Globulin, Total: 2.8 g/dL (ref 1.5–4.5)
Glucose: 102 mg/dL — ABNORMAL HIGH (ref 65–99)
Potassium: 4.8 mmol/L (ref 3.5–5.2)
Sodium: 139 mmol/L (ref 134–144)
Total Protein: 7 g/dL (ref 6.0–8.5)

## 2016-01-10 LAB — MICROALBUMIN / CREATININE URINE RATIO
CREATININE, UR: 120.6 mg/dL
MICROALB/CREAT RATIO: 4.1 mg/g creat (ref 0.0–30.0)
Microalbumin, Urine: 4.9 ug/mL

## 2016-01-10 LAB — HEMOGLOBIN A1C
ESTIMATED AVERAGE GLUCOSE: 143 mg/dL
HEMOGLOBIN A1C: 6.6 % — AB (ref 4.8–5.6)

## 2016-01-10 NOTE — Patient Instructions (Signed)
Quad Strength: Quarter Squat    With feet shoulder-width apart and back against wall, slide down wall until knees are at 60. Return. Repeat __10__ times. Do __3__ sessions per day. CAUTION: You should not bend knees deep enough to cause pain.  Heel Raises    Stand with support. Tighten pelvic floor and hold. With knees straight, raise heels off ground. Hold _3__ seconds. Try single leg heel lift, holding on.  Repeat _10__ times. Do _3__ times a day.  Copyright  VHI. All rights reserved.

## 2016-01-10 NOTE — Therapy (Signed)
[place], [place], [address] Phone: [phone]   Fax:  [phone]  Physical Therapy Treatment  Patient Details  Name: Sharon Montgomery MRN: [id number] Date of Birth: 12/23/1955 Referring Provider: Leo Grosser  Encounter Date: 01/10/2016      PT End of Session - 01/10/16 1341    Visit Number 2   Number of Visits 6   Date for PT Re-Evaluation 02/16/16   PT Start Time 1330   PT Stop Time 1415   PT Time Calculation (min) 45 min   Activity Tolerance Patient tolerated treatment well   Behavior During Therapy [facility] for tasks assessed/performed      Past Medical History  Diagnosis Date  . Dyslipidemia   . Vitamin D deficiency   . History of breast cancer 2000  . Diabetes mellitus     TYPE 2  . Cancer     BREAST  . Hypertension     controlled with diet  . Colon polyp 2008    adenomatous, colonoscopy every 5 years  . Hyperlipidemia   . Obesity     Past Surgical History  Procedure Laterality Date  . Colonoscopy  2008  . Cesarean section    . Tonsillectomy    . Shoulder surgery      PIN PLACED IN RIGHT SHOULDER  . Mastectomy      LEFT SIDE    There were no vitals filed for this visit.  Visit Diagnosis:  Weakness  Hip pain, right  Osteoporosis      Subjective Assessment - 01/10/16 1335    Subjective Pt reports she was trying to "overdo it" yesterday wih the exercises and feel increased pain in L hip.  Pt reports it feels like "joint pain" and not mm soreness.    Currently in Pain? Yes   Pain Score 5    Pain Location Hip   Pain Orientation Left   Pain Descriptors / Indicators Aching   Pain Type Acute pain   Pain Onset Yesterday   Pain Frequency Occasional                         [facility] Adult PT Treatment/Exercise - 01/10/16 0001    Knee/Hip Exercises: Aerobic   Stationary Bike 8 mins L 4   Knee/Hip Exercises: Machines for Strengthening   Total Gym Leg Press 10 x 2,  plate 2 ( bilat LE)   VCs to avoid hyper-extension and controled eccentric control   Knee/Hip Exercises: Standing   Heel Raises Right;Left;Both;3 sets;10 reps   Functional Squat 2 sets;10 reps   Wall Squat 2 sets;10 reps   SLS 10 secs R side, 3 secs L side, x 5 trials each .    Knee/Hip Exercises: Supine   Bridges 2 sets;10 reps  HEP   Bridges Limitations with hip ADD   Knee/Hip Exercises: Sidelying   Hip ABduction 2 sets;10 reps  HEP, BIL    Clams 10 x 2  HEP, Bil                 PT Education - 01/10/16 1341    Education provided Yes   Education Details Reviewed prescribed HEP and added wall sits and heel raises.    Person(s) Educated Patient   Methods Explanation;Demonstration   Comprehension Verbalized understanding          PT Short Term Goals - 01/02/16 1543    PT SHORT TERM GOAL #1  Title STGs= LTGs           PT Long Term Goals - 01/10/16 1349    PT LONG TERM GOAL #1   Title Establish HEP for continued strengthening and transistion to communiy program by 01/30/16.    Time 4   Period Weeks   Status On-going   PT LONG TERM GOAL #2   Title Hip ABDuction improved from 3-/5 to 4-/5 in order to sleep without R hip pain at night by 02/09/16.   Time 6   Period Weeks   Status On-going   PT LONG TERM GOAL #3   Title Pt will perform sit to stand x 5 in 30 secs by 02/09/16.    Time 6   Period Weeks   Status On-going               Plan - 01/10/16 1343    Clinical Impression Statement Pt tolerated progressed HEP with increased reps and additional CKC ther ex of heel raises and wall squats, despite increased c/o of L hip pain. Pt had SOB with wall slides/ squats. Pain was abolished in L hip following treatment. Pt had more difficutly with SLS on L LE compared to R.    Rehab Potential Excellent   PT Frequency 2x / week   PT Duration 6 weeks   PT Home Exercise Plan wall sits , heel raises   Consulted and Agree with Plan of Care Patient         Problem List Patient Active Problem List   Diagnosis Date Noted  . Unspecified vitamin D deficiency 11/09/2012  . Type II diabetes mellitus, uncontrolled ([place]) 10/09/2011  . Dyslipidemia 10/09/2011  . Obesity 10/09/2011  . Hyperlipidemia 06/04/2011  . Essential hypertension, benign 06/04/2011    Dollene Cleveland, PT 01/10/2016, 2:10 PM  [facility] [address] [place], [place], [address] Phone: [phone]   Fax:  [phone]  Name: Sharon Montgomery MRN: [id number] Date of Birth: [birth year]

## 2016-01-15 ENCOUNTER — Ambulatory Visit: Payer: 59

## 2016-01-15 DIAGNOSIS — M25551 Pain in right hip: Secondary | ICD-10-CM

## 2016-01-15 DIAGNOSIS — R531 Weakness: Secondary | ICD-10-CM

## 2016-01-15 DIAGNOSIS — M810 Age-related osteoporosis without current pathological fracture: Secondary | ICD-10-CM

## 2016-01-15 NOTE — Therapy (Signed)
[place], [place], [address] Phone: [phone]   Fax:  [phone]  Physical Therapy Treatment  Patient Details  Name: Sharon Montgomery MRN: [id number] Date of Birth: [birth year] Referring Provider: Leo Grosser  Encounter Date: 01/15/2016      PT End of Session - 01/15/16 1456    Visit Number 3   Number of Visits 6   Date for PT Re-Evaluation 02/16/16   PT Start Time [id number]   PT Stop Time 1500   PT Time Calculation (min) 45 min   Activity Tolerance Patient tolerated treatment well   Behavior During Therapy [facility] for tasks assessed/performed      Past Medical History  Diagnosis Date  . Dyslipidemia   . Vitamin D deficiency   . History of breast cancer 2000  . Diabetes mellitus     TYPE 2  . Cancer     BREAST  . Hypertension     controlled with diet  . Colon polyp 2008    adenomatous, colonoscopy every 5 years  . Hyperlipidemia   . Obesity     Past Surgical History  Procedure Laterality Date  . Colonoscopy  2008  . Cesarean section    . Tonsillectomy    . Shoulder surgery      PIN PLACED IN RIGHT SHOULDER  . Mastectomy      LEFT SIDE    There were no vitals filed for this visit.  Visit Diagnosis:  Weakness  Hip pain, right  Osteoporosis      Subjective Assessment - 01/15/16 1427    Subjective Pt reports increased fatigue and mm soreness after last visit, but no lasting adverse effects. Pt reports compliance with HEP.    Currently in Pain? No/denies                         [facility] Adult PT Treatment/Exercise - 01/15/16 0001    Exercises   Exercises Shoulder   Knee/Hip Exercises: Aerobic   Stationary Bike 8 mins, L 4   Knee/Hip Exercises: Machines for Strengthening   Total Gym Leg Press 10 x 2 sets , plate : 2  OMEGA: 075-GRM x 1 set, leg press: 40# x 1 set.    Knee/Hip Exercises: Standing   Heel Raises 20 reps   Wall Squat --  unable to perform due to weakness from  mm fatigue   SLS 10 secs x 3 reps each side.    Knee/Hip Exercises: Seated   Long Arc Quad 10 reps;Weights   Long Arc Quad Weight 15 lbs.   Long Arc Quad Limitations OMEGA: LAQ   Hamstring Curl 1 set;10 reps;Weights    Hamstring Weights 20 lbs.   Shoulder Exercises: Seated   Row 10 reps;Weights   Row Weight (lbs) 20   Row Limitations on OMEGA machine    Other Seated Exercises Chest press on machine 20#, 10 x    Shoulder Exercises: Standing   Protraction 10 reps;Other (comment)   Protraction Limitations WALL PUSH UPS 10 x 2 sets    Other Standing Exercises Lat pull down: Omega: 20# 10 x 2.    Other Standing Exercises Bicep curls: Omega: 15# , 10 x 2 sets.                 PT Education - 01/15/16 1456    Education provided Yes   Education Details Cotninue HEP.    Person(s) Educated Patient   Methods Explanation  Comprehension Verbalized understanding          PT Short Term Goals - 01/02/16 1543    PT SHORT TERM GOAL #1   Title STGs= LTGs           PT Long Term Goals - 01/10/16 1349    PT LONG TERM GOAL #1   Title Establish HEP for continued strengthening and transistion to communiy program by 01/30/16.    Time 4   Period Weeks   Status On-going   PT LONG TERM GOAL #2   Title Hip ABDuction improved from 3-/5 to 4-/5 in order to sleep without R hip pain at night by 02/09/16.   Time 6   Period Weeks   Status On-going   PT LONG TERM GOAL #3   Title Pt will perform sit to stand x 5 in 30 secs by 02/09/16.    Time 6   Period Weeks   Status On-going               Plan - 01/15/16 1457    Clinical Impression Statement Today's visit concentrated on use of machines for strength training and weight bearing ther ex. Instruction in form and use of machines with good tolerance. Pt has gym to use at her place of employment with similar equipment.  Attmepted wall squats at end of treatment and L LE gave out slightly due to mm fatigue, so discontinued. Performed  SLS and able to maintain for 10 secs on L LE today.    PT Next Visit Plan Use of multigym ( OMEGA) to progress total body strength training ther ex, Wall squats, step ups, heep raises. Side step with mini squat. Add stretches.    Consulted and Agree with Plan of Care Patient        Problem List Patient Active Problem List   Diagnosis Date Noted  . Unspecified vitamin D deficiency 11/09/2012  . Type II diabetes mellitus, uncontrolled ([place]) 10/09/2011  . Dyslipidemia 10/09/2011  . Obesity 10/09/2011  . Hyperlipidemia 06/04/2011  . Essential hypertension, benign 06/04/2011    Dollene Cleveland , PT  01/15/2016, 3:04 PM  [facility] [address] [place], [place], [address] Phone: [phone]   Fax:  [phone]  Name: OSCEOLA WEVER MRN: [id number] Date of Birth: 1956/06/23

## 2016-01-17 ENCOUNTER — Ambulatory Visit: Payer: 59 | Admitting: Physical Therapy

## 2016-01-17 DIAGNOSIS — R531 Weakness: Secondary | ICD-10-CM

## 2016-01-17 DIAGNOSIS — M25551 Pain in right hip: Secondary | ICD-10-CM

## 2016-01-17 NOTE — Therapy (Signed)
[place], [place], [address] Phone: [phone]   Fax:  [phone]  Physical Therapy Treatment  Patient Details  Name: Sharon Montgomery MRN: [id number] Date of Birth: December 18, 1955 Referring Provider: Leo Grosser  Encounter Date: 01/17/2016      PT End of Session - 01/17/16 1414    Visit Number 4   Number of Visits 6   Date for PT Re-Evaluation 02/16/16   PT Start Time [id number]   PT Stop Time 1413   PT Time Calculation (min) 40 min   Activity Tolerance Patient tolerated treatment well   Behavior During Therapy [facility] for tasks assessed/performed      Past Medical History  Diagnosis Date  . Dyslipidemia   . Vitamin D deficiency   . History of breast cancer 2000  . Diabetes mellitus     TYPE 2  . Cancer     BREAST  . Hypertension     controlled with diet  . Colon polyp 2008    adenomatous, colonoscopy every 5 years  . Hyperlipidemia   . Obesity     Past Surgical History  Procedure Laterality Date  . Colonoscopy  2008  . Cesarean section    . Tonsillectomy    . Shoulder surgery      PIN PLACED IN RIGHT SHOULDER  . Mastectomy      LEFT SIDE    There were no vitals filed for this visit.  Visit Diagnosis:  Weakness  Hip pain, right      Subjective Assessment - 01/17/16 1334    Subjective Exhausted after the last session but made it. A little sore after the last session but did well.    Currently in Pain? No/denies                         [facility] Adult PT Treatment/Exercise - 01/17/16 0001    Knee/Hip Exercises: Aerobic   Stationary Bike 8 mins, L 4   Knee/Hip Exercises: Machines for Strengthening   Total Gym Leg Press 10 x 2 sets , plate : 2  OMEGA:  leg press: 40#   Knee/Hip Exercises: Standing   Heel Raises 2 sets;10 reps   Hip Abduction Stengthening;Both;2 sets;10 reps   Knee/Hip Exercises: Seated   Long Arc Quad 10 reps;Weights   Long Arc Quad Weight 15 lbs.   Long  Arc [employer] Limitations OMEGA: LAQ   Knee/Hip Exercises: Supine   Bridges 2 sets;10 reps   Other Supine Knee/Hip Exercises pelvic tilt 1X10, cues for breathing.    Shoulder Exercises: Seated   Row 10 reps;Weights   Row Weight (lbs) 20   Row Limitations on OMEGA machine 2X10   Other Seated Exercises Chest press on machine 20#, 2X10   Shoulder Exercises: Standing   Other Standing Exercises Lat pull down: Omega: 20# 10 x 2.    Other Standing Exercises Bicep curls: Omega: 15# , 10 x 2 sets.                 PT Education - 01/17/16 1413    Education provided Yes   Education Details review of HEP technique as needed.    Person(s) Educated Patient   Methods Tactile cues;Verbal cues   Comprehension Verbalized understanding;Returned demonstration          PT Short Term Goals - 01/02/16 1543    PT SHORT TERM GOAL #1   Title STGs= LTGs  PT Long Term Goals - 01/10/16 1349    PT LONG TERM GOAL #1   Title Establish HEP for continued strengthening and transistion to communiy program by 01/30/16.    Time 4   Period Weeks   Status On-going   PT LONG TERM GOAL #2   Title Hip ABDuction improved from 3-/5 to 4-/5 in order to sleep without R hip pain at night by 02/09/16.   Time 6   Period Weeks   Status On-going   PT LONG TERM GOAL #3   Title Pt will perform sit to stand x 5 in 30 secs by 02/09/16.    Time 6   Period Weeks   Status On-going               Plan - 01/17/16 1414    Clinical Impression Statement Patient reporting that she is feeling tired after session but no pain. Patient did report 1 episode of rt knee pain which resolved with ambulation. Minimal rest periods taken, with 1 active rest with ambulation. Patient reports that she is starting to do some of her exercises at work now too. Patinet denies any questions or concerns after session.    PT Next Visit Plan Use of multigym ( OMEGA) to progress strength training ther ex, Wall squats, step ups, heep  raises. Side step with mini squat. Add stretches.    PT Home Exercise Plan add wall slides, heel raises, hip abd to HEP   Consulted and Agree with Plan of Care Patient        Problem List Patient Active Problem List   Diagnosis Date Noted  . Unspecified vitamin D deficiency 11/09/2012  . Type II diabetes mellitus, uncontrolled ([place]) 10/09/2011  . Dyslipidemia 10/09/2011  . Obesity 10/09/2011  . Hyperlipidemia 06/04/2011  . Essential hypertension, benign 06/04/2011    Cassell Clement, PT, CSCS Pager [phone]  01/17/2016, 2:18 PM  [facility] [address] [place], [place], [address] Phone: [phone]   Fax:  [phone]  Name: MELIYAH TOMPKINS MRN: [id number] Date of Birth: 1956/07/16

## 2016-01-22 ENCOUNTER — Encounter: Payer: Self-pay | Admitting: Endocrinology

## 2016-01-22 ENCOUNTER — Ambulatory Visit (INDEPENDENT_AMBULATORY_CARE_PROVIDER_SITE_OTHER): Payer: 59 | Admitting: Endocrinology

## 2016-01-22 ENCOUNTER — Ambulatory Visit: Payer: 59

## 2016-01-22 VITALS — BP 130/70 | HR 91 | Temp 98.0°F | Resp 14 | Ht 65.0 in | Wt 219.7 lb

## 2016-01-22 DIAGNOSIS — E119 Type 2 diabetes mellitus without complications: Secondary | ICD-10-CM | POA: Diagnosis not present

## 2016-01-22 NOTE — Patient Instructions (Signed)
Check blood sugars on waking up 1-2  times a week Also check blood sugars about 2 hours after a meal and do this after different meals by rotation  Recommended blood sugar levels on waking up is 90-120 and about 2 hours after meal is 120-150  Please bring your blood sugar monitor to each visit, thank you

## 2016-01-22 NOTE — Progress Notes (Signed)
Patient ID: Sharon Montgomery, female   DOB: February 06, 1956, 60 y.o.   MRN: [id number]    Reason for Appointment: Followup for Type 2 Diabetes  Referring physician: Moreen Fowler  History of Present Illness:          Diagnosis: Type 2 diabetes mellitus, date of diagnosis: 2005       Past history:    She was diagnosed to have diabetes about 10 years ago when she complained about blurred vision and increased thirst Her glucose was over 300 at bedtime and her weight was about 245 pounds   She thinks she was treated with Avandia initially and she took this for about  8 years   At some point she was also given metformin which was continued   Because of worsening control  she was given Onglyza in addition to her metformin and she had been taking Kombiglyze for the last 1-2 years   Also pioglitazone  15 mg was started in early 2015  to help with control  Prior to her referral she had a A1c of 10.5% on 05/13/14  On her initial consultation she did well with starting on a combination of Invokana and metformin along with continuing her low-dose Actos.  Subsequently 2 mg Amaryl was added in the evening but later discontinued in 7/15  Recent history:   Oral hypoglycemic drugs the patient is taking are:  Actos 15 mg, Invokamet 50/1000 twice a day  Her blood sugars have been fairly stable with continuing Invokana She is tolerating all her medications well However she may occasionally forget to take her evening dose  Although her blood sugars are looking fairly good overall she apparently has gained weight since her last visit A1c is still about the same at 6.6  Current problems identified:  She goes on and off her diet.  She has gone back to Weight Wat[employer] about a month ago because of the weight gain and she thinks she is losing weight but her weight is significantly higher today in the office  She has only recently started an exercise program and does not stay consistent with this  She  does not check her sugars much and does not bring her monitor for download confirmed readings  Maybe sometimes eating cereal in the morning for breakfast without protein      Glucose monitoring results by recall:  Am 80-100      pc 120   Self-care: The diet that the patient has been following is: tries to limit carbohydrates especially recently.   She has again started Weight Watchers diet about a month ago Generally has egg/cereal for breakfast, eating out about 2-3 times a week , snacks are mostly fruits. Usually having  Diet drinks    Exercise:  only this month, she has an exercise room at work Dietician visit: Most recent: 2013.                 Weight history: previous range 199-245  Wt Readings from Last 3 Encounters:  01/22/16 219 lb 11.2 oz (99.655 kg)  07/07/15 210 lb 3.2 oz (95.346 kg)  03/03/15 210 lb (95.255 kg)   Glycemic control:   Lab Results  Component Value Date   HGBA1C 6.6* 01/09/2016   HGBA1C 6.4* 06/27/2015   HGBA1C 6.5* 02/24/2015   Lab Results  Component Value Date   MICROALBUR 1.96* 11/09/2012   LDLCALC 115* 01/09/2016   CREATININE 0.83 01/09/2016  No visits with results within 1 Week(s) from this visit. Latest known visit with results is:  Orders Only on 01/09/2016  Component Date Value Ref Range Status  . Glucose 01/09/2016 102* 65 - 99 mg/dL Final  . BUN 01/09/2016 17  6 - 24 mg/dL Final  . Creatinine, Ser 01/09/2016 0.83  0.57 - 1.00 mg/dL Final  . GFR calc non Af Amer 01/09/2016 78  >59 mL/min/1.73 Final  . GFR calc Af Amer 01/09/2016 90  >59 mL/min/1.73 Final  . BUN/Creatinine Ratio 01/09/2016 20  9 - 23 Final  . Sodium 01/09/2016 139  134 - 144 mmol/L Final  . Potassium 01/09/2016 4.8  3.5 - 5.2 mmol/L Final  . Chloride 01/09/2016 97  96 - 106 mmol/L Final  . CO2 01/09/2016 25  18 - 29 mmol/L Final  . Calcium 01/09/2016 10.1  8.7 - 10.2 mg/dL Final  . Total Protein 01/09/2016 7.0  6.0 - 8.5 g/dL Final  . Albumin 01/09/2016 4.2  3.5  - 5.5 g/dL Final  . Globulin, Total 01/09/2016 2.8  1.5 - 4.5 g/dL Final  . Albumin/Globulin Ratio 01/09/2016 1.5  1.1 - 2.5 Final  . Bilirubin Total 01/09/2016 0.4  0.0 - 1.2 mg/dL Final  . Alkaline Phosphatase 01/09/2016 95  39 - 117 IU/L Final  . AST 01/09/2016 21  0 - 40 IU/L Final  . ALT 01/09/2016 17  0 - 32 IU/L Final  . Cholesterol, Total 01/09/2016 228* 100 - 199 mg/dL Final  . Triglycerides 01/09/2016 62  0 - 149 mg/dL Final  . HDL 01/09/2016 101  >39 mg/dL Final  . VLDL Cholesterol Cal 01/09/2016 12  5 - 40 mg/dL Final  . LDL Calculated 01/09/2016 115* 0 - 99 mg/dL Final  . Chol/HDL Ratio 01/09/2016 2.3  0.0 - 4.4 ratio units Final   Comment:                                   T. Chol/HDL Ratio                                             Men  Women                               1/2 Avg.Risk  3.4    3.3                                   Avg.Risk  5.0    4.4                                2X Avg.Risk  9.6    7.1                                3X Avg.Risk 23.4   11.0   . Creatinine, Urine 01/09/2016 120.6  Not Estab. mg/dL Final  . Microalbum.,U,Random 01/09/2016 4.9  Not Estab. ug/mL Final  . MICROALB/CREAT RATIO 01/09/2016 4.1  0.0 - 30.0 mg/g creat Final  . Hgb A1c MFr Bld 01/09/2016 6.6* 4.8 -  5.6 % Final   Comment:          Pre-diabetes: 5.7 - 6.4          Diabetes: >6.4          Glycemic control for adults with diabetes: <7.0   . Est. average glucose Bld gHb Est-m* 01/09/2016 143   Final      Medication List       This list is accurate as of: 01/22/16  5:04 PM.  Always use your most recent med list.               aspirin 81 MG tablet  Take 81 mg by mouth daily.     CLARITIN-D 12 HOUR PO  Take 1 tablet by mouth daily.     ergocalciferol 50000 units capsule  Commonly known as:  VITAMIN D2  Take 1 capsule (50,000 Units total) by mouth once a week.     freestyle lancets  Use as instructed to check blood sugar one time per day dx code 250.00     glucose  blood test strip  Commonly known as:  FREESTYLE LITE  Use as instructed to check blood sugar one time per day dx code 250.00     INVOKAMET 50-1000 MG Tabs  Generic drug:  Canagliflozin-Metformin HCl  Take 1 tablet by mouth two  times daily with food     LIPITOR 40 MG tablet  Generic drug:  atorvastatin  Take 20 mg by mouth daily.     atorvastatin 20 MG tablet  Commonly known as:  LIPITOR     losartan 50 MG tablet  Commonly known as:  COZAAR     pioglitazone 15 MG tablet  Commonly known as:  ACTOS  Take 1 tablet (15 mg total) by mouth daily.     pioglitazone 15 MG tablet  Commonly known as:  ACTOS  Take 1 tablet by mouth  daily     pioglitazone 15 MG tablet  Commonly known as:  ACTOS  Take 1 tablet by mouth  daily        Allergies:  Allergies  Allergen Reactions  . Lisinopril Cough    Past Medical History  Diagnosis Date  . Dyslipidemia   . Vitamin D deficiency   . History of breast cancer 2000  . Diabetes mellitus     TYPE 2  . Cancer (HCC)     BREAST  . Hypertension     controlled with diet  . Colon polyp 2008    adenomatous, colonoscopy every 5 years  . Hyperlipidemia   . Obesity     Past Surgical History  Procedure Laterality Date  . Colonoscopy  2008  . Cesarean section    . Tonsillectomy    . Shoulder surgery      PIN PLACED IN RIGHT SHOULDER  . Mastectomy      LEFT SIDE    Family History  Problem Relation Age of Onset  . Hypertension Father   . Diabetes Mother   . Diabetes Maternal Grandmother     Social History:  reports that she has never smoked. She has never used smokeless tobacco. She reports that she does not drink alcohol or use illicit drugs.    Review of Systems       Lipids:  She has been treated with Lipitor and she does not know if her dose was increased on her last visit with PCP LDL is still over 100  Lab Results  Component Value Date   CHOL 228* 01/09/2016   HDL 101 01/09/2016   LDLCALC 115* 01/09/2016    TRIG 62 01/09/2016   CHOLHDL 2.3 01/09/2016    Mild hypertension treated with only 25 mg Cozaar and blood pressure is fairly good  Probably benefiting from [place] also   Physical Examination:  BP 130/70 mmHg  Pulse 91  Temp(Src) 98 F (36.7 C)  Resp 14  Ht 5\' 5"  (1.651 m)  Wt 219 lb 11.2 oz (99.655 kg)  BMI 36.56 kg/m2  SpO2 96%   ASSESSMENT:  Diabetes type 2,   Her blood sugars are fairly consistently controlled now with her regimen of  Invokana along with metformin and Actos 15 mg  She did not bring her monitor for download again Although she thinks her blood sugars are perfect normal A1c indicates an average of 140 Her main problem is difficulty with keeping her weight down and being consistent with diet and exercise   PLAN:  She needs to be consistent with  her diet and exercise regimen Meanwhile continue same medications, she can try to take Invokamet XR on her next prescription for better compliance To bring her monitor on the next visit  Continue follow-up with PCP for hyperlipidemia   She will request the report of her bone density from the gynecologist which was reportedly abnormal   Patient Instructions  Check blood sugars on waking up 1-2  times a week Also check blood sugars about 2 hours after a meal and do this after different meals by rotation  Recommended blood sugar levels on waking up is 90-120 and about 2 hours after meal is 120-150  Please bring your blood sugar monitor to each visit, thank you      [employer] 01/22/2016, 5:04 PM   Note: This office note was prepared with Dragon voice recognition system technology. Any transcriptional errors that result from this process are unintentional.

## 2016-01-24 ENCOUNTER — Ambulatory Visit: Payer: 59

## 2016-01-24 DIAGNOSIS — R531 Weakness: Secondary | ICD-10-CM | POA: Diagnosis not present

## 2016-01-24 DIAGNOSIS — M25551 Pain in right hip: Secondary | ICD-10-CM

## 2016-01-24 DIAGNOSIS — M810 Age-related osteoporosis without current pathological fracture: Secondary | ICD-10-CM

## 2016-01-24 NOTE — Therapy (Addendum)
[place], [place], [address] Phone: [phone]   Fax:  [phone]  Physical Therapy Treatment  and Discharge Summary  Patient Details  Name: Sharon Montgomery MRN: [id number] Date of Birth: 1956-01-28 Referring Provider: Leo Grosser  Encounter Date: 01/24/2016      PT End of Session - 01/24/16 1504    Visit Number 5   Number of Visits 6   Date for PT Re-Evaluation 02/16/16   PT Start Time 5009   PT Stop Time 1500   PT Time Calculation (min) 40 min   Activity Tolerance Patient tolerated treatment well   Behavior During Therapy [facility] for tasks assessed/performed      Past Medical History  Diagnosis Date  . Dyslipidemia   . Vitamin D deficiency   . History of breast cancer 2000  . Diabetes mellitus     TYPE 2  . Cancer (HCC)     BREAST  . Hypertension     controlled with diet  . Colon polyp 2008    adenomatous, colonoscopy every 5 years  . Hyperlipidemia   . Obesity     Past Surgical History  Procedure Laterality Date  . Colonoscopy  2008  . Cesarean section    . Tonsillectomy    . Shoulder surgery      PIN PLACED IN RIGHT SHOULDER  . Mastectomy      LEFT SIDE    There were no vitals filed for this visit.  Visit Diagnosis:  Weakness  Hip pain, right  Osteoporosis      Subjective Assessment - 01/24/16 1430    Subjective Pt reports she has been performing exercises daily at home and with use of equipment at work. Pt reports "achiness" in knee joints.     Currently in Pain? Yes   Pain Score 5    Pain Location Knee   Pain Orientation Left;Right   Pain Descriptors / Indicators Aching   Pain Type Acute pain            [facility] PT Assessment - 01/24/16 0001    Strength   Right Hip Flexion 4-/5   Right Hip ABduction 3+/5   Left Hip Flexion 4-/5   Left Hip ABduction 3/5                     [facility] Adult PT Treatment/Exercise - 01/24/16 0001    Knee/Hip Exercises:  Aerobic   Stationary Bike 8 mins L 4   Knee/Hip Exercises: Machines for Strengthening   Cybex Knee Flexion 20 x 25# on OMEGA   Total Gym Leg Press 10 x 2 sets , plate : 2  OMEGA:  leg press: 45#   Knee/Hip Exercises: Standing   Heel Raises 20 reps   Other Standing Knee Exercises sit to stand x 5 from low mat table + 6" 5 x 2 sets    Knee/Hip Exercises: Sidelying   Hip ABduction 1 set;10 reps   Shoulder Exercises: Seated   Row 20 reps   Row Weight (lbs) 25   Row Limitations on OMEGA machine 25#   Other Seated Exercises Chest press on machine 25#, 2X10   Shoulder Exercises: Standing   Other Standing Exercises Lat pull down: Omega: 25# 10 x 2.    Other Standing Exercises Bicep curls: Omega: 15# x 7 reps , 10# x 7 reps                   PT  Short Term Goals - 01/02/16 1543    PT SHORT TERM GOAL #1   Title STGs= LTGs           PT Long Term Goals - 01/24/16 1448    PT LONG TERM GOAL #1   Title Establish HEP for continued strengthening and transistion to communiy program by 01/30/16.    Time 4   Period Weeks   Status On-going   PT LONG TERM GOAL #2   Title Hip ABDuction improved from 3-/5 to 4-/5 in order to sleep without R hip pain at night by 02/09/16.   Time 6   Period Weeks   Status On-going   PT LONG TERM GOAL #3   Title Pt will perform sit to stand x 5 in 30 secs by 02/09/16.    Time 6   Period Weeks   Status On-going               Plan - 01/24/16 1501    Clinical Impression Statement Pt is progressing toward goals with Hip ABD strength improved. Pt is becoming indep with HEP for home and with equipment at work and reports compliance. Plan to see pt one more visit and discharge, if no further concerns and goals met.    PT Next Visit Plan FOTO. Reassess goals and DC   Consulted and Agree with Plan of Care Patient        Problem List Patient Active Problem List   Diagnosis Date Noted  . Unspecified vitamin D deficiency 11/09/2012  . Type II  diabetes mellitus, uncontrolled ([place]) 10/09/2011  . Dyslipidemia 10/09/2011  . Obesity 10/09/2011  . Hyperlipidemia 06/04/2011  . Essential hypertension, benign 06/04/2011      Addendum created 02/05/2016 PHYSICAL THERAPY DISCHARGE SUMMARY   PT received notification from front office stating pt called to cancel last appt and that she wished to be discharged and was doing well.   Visits from Start of Care: 5   Current functional level related to goals / functional outcomes: See above. Current status of goals unknown due to DC without a visit.    Remaining deficits: none    Education / Equipment: Indep with HEP   Plan: Patient agrees to discharge.  Patient goals were met. Patient is being discharged due to being pleased with the current functional level.  ?????   Dollene Cleveland, PT, DPT 02/05/2016 3:15 PM Phone: [phone] Fax: [phone]     Dollene Cleveland, PT 01/24/2016, 3:05 PM  [facility] [address] [place], [place], [address] Phone: [phone]   Fax:  [phone]  Name: Sharon Montgomery MRN: [id number] Date of Birth: 1956/11/06

## 2016-01-29 ENCOUNTER — Ambulatory Visit: Payer: 59

## 2016-01-29 ENCOUNTER — Telehealth: Payer: Self-pay

## 2016-01-29 NOTE — Telephone Encounter (Signed)
PT called pt to notify of NS and next appt scheduled. PT left message on pt's voicemail,  requested return call to confirm appt or notify office,  if pt does not plan on attending.

## 2016-02-05 ENCOUNTER — Ambulatory Visit: Payer: 59

## 2016-04-02 ENCOUNTER — Other Ambulatory Visit: Payer: Self-pay | Admitting: Endocrinology

## 2016-05-15 ENCOUNTER — Telehealth: Payer: Self-pay | Admitting: Family Medicine

## 2016-05-15 ENCOUNTER — Telehealth: Payer: Self-pay | Admitting: Endocrinology

## 2016-05-15 MED ORDER — CANAGLIFLOZIN-METFORMIN HCL 50-1000 MG PO TABS: ORAL_TABLET | ORAL | Status: DC

## 2016-05-15 NOTE — Telephone Encounter (Signed)
Patient ask if she can 6 pills until she can get her medication through mail order,please let her know when she can come pick it up. Send to  [id number] [place], [place] (Phone) [phone] (Fax)

## 2016-05-15 NOTE — Telephone Encounter (Signed)
Rx submitted

## 2016-05-15 NOTE — Telephone Encounter (Signed)
Patient would like her Invokomet prescription refilled until her next appointment.

## 2016-05-16 MED ORDER — CANAGLIFLOZIN-METFORMIN HCL 50-1000 MG PO TABS: ORAL_TABLET | ORAL | Status: AC

## 2016-05-16 NOTE — Telephone Encounter (Signed)
[employer] noted dated 05/15/16 6:25 PM Caller states she is needing at least a few pills of the invokamet to hold her until her meds come from the mail order pharmacy

## 2016-05-16 NOTE — Telephone Encounter (Signed)
Rx submitted per pt's request.  

## 2016-05-22 ENCOUNTER — Ambulatory Visit: Payer: 59 | Admitting: Endocrinology

## 2016-07-04 ENCOUNTER — Other Ambulatory Visit: Payer: Self-pay | Admitting: Endocrinology

## 2016-07-30 LAB — LIPID PANEL
Cholesterol: 223 mg/dL — AB (ref 0–200)
LDL CALC: 105 mg/dL

## 2016-07-30 LAB — HEMOGLOBIN A1C: HEMOGLOBIN A1C: 6.4

## 2016-08-19 ENCOUNTER — Other Ambulatory Visit: Payer: Self-pay | Admitting: Obstetrics and Gynecology

## 2016-08-19 DIAGNOSIS — Z1231 Encounter for screening mammogram for malignant neoplasm of breast: Secondary | ICD-10-CM

## 2016-08-28 ENCOUNTER — Telehealth: Payer: Self-pay | Admitting: Endocrinology

## 2016-08-28 NOTE — Telephone Encounter (Signed)
Pt called and needs the Invokamet refilled and sent to [facility].

## 2016-08-29 NOTE — Telephone Encounter (Signed)
Denied, she hasn't been seen since February. It won't be refilled until she's seen in the office.

## 2016-09-05 ENCOUNTER — Other Ambulatory Visit: Payer: Self-pay | Admitting: Endocrinology

## 2016-09-10 ENCOUNTER — Encounter: Payer: Self-pay | Admitting: *Deleted

## 2016-09-20 ENCOUNTER — Ambulatory Visit
Admission: RE | Admit: 2016-09-20 | Discharge: 2016-09-20 | Disposition: A | Discharge status: Home / Self Care | Payer: 59 | Source: Ambulatory Visit | Attending: Obstetrics and Gynecology | Admitting: Obstetrics and Gynecology

## 2016-09-20 DIAGNOSIS — Z1231 Encounter for screening mammogram for malignant neoplasm of breast: Secondary | ICD-10-CM

## 2016-10-22 ENCOUNTER — Telehealth: Payer: Self-pay | Admitting: Endocrinology

## 2016-10-22 NOTE — Telephone Encounter (Signed)
Spoke with pt she has self dismissed herself. Please complete the dismissal protocol.

## 2016-10-22 NOTE — Telephone Encounter (Signed)
-----   Message from Elayne Snare, MD sent at 10/20/2016  5:54 PM EST ----- Please schedule follow-up

## 2016-10-28 NOTE — Telephone Encounter (Signed)
Done

## 2017-09-17 ENCOUNTER — Other Ambulatory Visit: Payer: Self-pay | Admitting: Obstetrics and Gynecology

## 2017-09-17 DIAGNOSIS — Z1231 Encounter for screening mammogram for malignant neoplasm of breast: Secondary | ICD-10-CM

## 2017-09-26 ENCOUNTER — Ambulatory Visit
Admission: RE | Admit: 2017-09-26 | Discharge: 2017-09-26 | Disposition: A | Discharge status: Home / Self Care | Payer: 59 | Source: Ambulatory Visit | Attending: Obstetrics and Gynecology | Admitting: Obstetrics and Gynecology

## 2017-09-26 DIAGNOSIS — Z1231 Encounter for screening mammogram for malignant neoplasm of breast: Secondary | ICD-10-CM

## 2017-11-06 ENCOUNTER — Encounter: Payer: Self-pay | Admitting: Gastroenterology

## 2017-12-03 ENCOUNTER — Other Ambulatory Visit: Payer: Self-pay | Admitting: Obstetrics and Gynecology

## 2017-12-05 ENCOUNTER — Encounter (HOSPITAL_BASED_OUTPATIENT_CLINIC_OR_DEPARTMENT_OTHER): Payer: Self-pay

## 2017-12-05 ENCOUNTER — Other Ambulatory Visit: Payer: Self-pay

## 2017-12-05 NOTE — Progress Notes (Signed)
Spoke with:  Hilda Blades NPO: After Midnight Arrival time: 51AM Labs: EKG, ISTAT4 AM medications: Losartan, Atorvastatin Pre op orders: Needs second sign Ride home: Lennette Bihari (husband) [phone]

## 2017-12-25 ENCOUNTER — Encounter (HOSPITAL_BASED_OUTPATIENT_CLINIC_OR_DEPARTMENT_OTHER): Payer: Self-pay | Admitting: Obstetrics and Gynecology

## 2017-12-25 DIAGNOSIS — N649 Disorder of breast, unspecified: Secondary | ICD-10-CM | POA: Diagnosis present

## 2017-12-25 DIAGNOSIS — R87810 Cervical high risk human papillomavirus (HPV) DNA test positive: Secondary | ICD-10-CM | POA: Diagnosis present

## 2017-12-25 NOTE — H&P (Signed)
Sharon Montgomery is an 62 y.o. female. 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  Colpo showed no exocervical lesions, but ECC showed HGSIL  Pertinent Gynecological History: Menses: post-menopausal Bleeding: none Contraception: tubal ligation DES exposure: denies Blood transfusions: none Sexually transmitted diseases: no past history Previous GYN Procedures: tubal ligation  Last mammogram: normal Date: 2018 Last pap: normal with positive HR HPV Date: 2018 OB History: G4, P4   Menstrual History: Menarche age: 29 No LMP recorded. Patient is postmenopausal.    Past Medical History:  Diagnosis Date  . Breast disorder    breast cancer  . Cancer (HCC)    LEFT BREAST  . Colon polyp 2008   adenomatous, colonoscopy every 5 years  . Complication of anesthesia    itching  . Diabetes mellitus    TYPE 2  . Dyslipidemia   . Eczema   . Hair loss   . History of breast cancer 2000  . Hyperlipidemia   . Hypertension    controlled with diet  . Obesity   . Osteoporosis   . Psoriasis   . Seborrheic dermatitis of scalp   . Thoracic spondylosis 11/10/2015   mild  . Vitamin D deficiency     Past Surgical History:  Procedure Laterality Date  . CESAREAN SECTION    . COLONOSCOPY W/ POLYPECTOMY  2008  . MASTECTOMY  1998   LEFT SIDE, lymph node removal  . SHOULDER SURGERY     PIN PLACED IN RIGHT SHOULDER  . TONSILLECTOMY      Family History  Problem Relation Age of Onset  . Hypertension Father   . Diabetes Mother   . Diabetes Maternal Grandmother     Social History:  reports that  has never smoked. she has never used smokeless tobacco. She reports that she does not drink alcohol or use drugs.  Allergies:  Allergies  Allergen Reactions  . Lisinopril Cough    Medications Prior to Admission  Medication Sig Dispense Refill Last Dose  . aspirin 81 MG tablet Take 81 mg by mouth daily.     12/25/2017 at Unknown time  . atorvastatin (LIPITOR)  20 MG tablet 20 mg daily at 6 PM.    12/25/2017 at Unknown time  . Canagliflozin-Metformin HCl (INVOKAMET) 50-1000 MG TABS Take 1 tablet by mouth two  times daily with food (Patient taking differently: daily. Take 1 tablet by mouth two  times daily with food) 10 tablet 0 12/25/2017 at Unknown time  . glucose blood (FREESTYLE LITE) test strip Use as instructed to check blood sugar one time per day dx code 250.00 50 each 3 12/25/2017 at Unknown time  . Lancets (FREESTYLE) lancets Use as instructed to check blood sugar one time per day dx code 250.00 100 each 1 12/25/2017 at Unknown time  . Loratadine-Pseudoephedrine (CLARITIN-D 12 HOUR PO) Take 1 tablet by mouth daily.    Past Month at Unknown time  . losartan (COZAAR) 50 MG tablet    12/25/2017 at Unknown time  . ergocalciferol (VITAMIN D2) 50000 UNITS capsule Take 1 capsule (50,000 Units total) by mouth once a week. 12 capsule 0 12/22/2017    Review of Systems  Constitutional: Negative.   HENT: Negative.   Eyes: Negative.   Respiratory: Negative.   Cardiovascular: Negative.   Gastrointestinal: Negative.   Genitourinary: Negative.   Skin: Positive for itching.       Intermittent itching of abdominal skin  Neurological: Negative.  Psychiatric/Behavioral: Negative.     Blood pressure (!) 163/77, pulse 88, temperature 97.6 F (36.4 C), temperature source Oral, resp. rate 18, height 5\' 5"  (1.651 m), weight 191 lb 4.8 oz (86.8 kg), SpO2 100 %. Physical Exam  Constitutional: She is oriented to person, place, and time. She appears well-developed and well-nourished.  HENT:  Head: Atraumatic.  Eyes: EOM are normal.  Neck: Neck supple.  Cardiovascular: Normal rate and regular rhythm.  Respiratory: Effort normal and breath sounds normal.  GI: Soft. Bowel sounds are normal.  Genitourinary:  Genitourinary Comments: Pelvic exam:  VULVA: normal appearing vulva with no masses, tenderness or lesions,  VAGINA: normal appearing vagina with normal color  and discharge, no lesions,  CERVIX: normal appearing cervix without discharge or lesions,  UTERUS: does not feel enlarged, but exam limited by body habitus,  ADNEXA: normal adnexa in size, nontender and no masses, exam limited by body habitus.  Musculoskeletal: Normal range of motion.  Neurological: She is alert and oriented to person, place, and time.  Skin: Skin is warm and dry.  Psychiatric: She has a normal mood and affect.    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  Indications, risks and benefits reviewed and pt accepts risks of anesthesia, bleeding, infection, and damage to adjacent organs. Pt wants to proceed.  Sharon Montgomery 12/26/2017, 7:19 AM

## 2017-12-26 ENCOUNTER — Ambulatory Visit (HOSPITAL_BASED_OUTPATIENT_CLINIC_OR_DEPARTMENT_OTHER): Payer: Managed Care, Other (non HMO) | Admitting: Anesthesiology

## 2017-12-26 ENCOUNTER — Ambulatory Visit (HOSPITAL_BASED_OUTPATIENT_CLINIC_OR_DEPARTMENT_OTHER)
Admission: RE | Admit: 2017-12-26 | Discharge: 2017-12-26 | Disposition: A | Discharge status: Home / Self Care | Payer: Managed Care, Other (non HMO) | Source: Ambulatory Visit | Attending: Obstetrics and Gynecology | Admitting: Obstetrics and Gynecology

## 2017-12-26 ENCOUNTER — Encounter (HOSPITAL_BASED_OUTPATIENT_CLINIC_OR_DEPARTMENT_OTHER)
Admission: RE | Disposition: A | Discharge status: Home / Self Care | Payer: Self-pay | Source: Ambulatory Visit | Attending: Obstetrics and Gynecology

## 2017-12-26 ENCOUNTER — Encounter (HOSPITAL_BASED_OUTPATIENT_CLINIC_OR_DEPARTMENT_OTHER): Payer: Self-pay

## 2017-12-26 DIAGNOSIS — E669 Obesity, unspecified: Secondary | ICD-10-CM | POA: Diagnosis not present

## 2017-12-26 DIAGNOSIS — Z6831 Body mass index (BMI) 31.0-31.9, adult: Secondary | ICD-10-CM | POA: Diagnosis not present

## 2017-12-26 DIAGNOSIS — Z7982 Long term (current) use of aspirin: Secondary | ICD-10-CM | POA: Insufficient documentation

## 2017-12-26 DIAGNOSIS — Z7984 Long term (current) use of oral hypoglycemic drugs: Secondary | ICD-10-CM | POA: Insufficient documentation

## 2017-12-26 DIAGNOSIS — E785 Hyperlipidemia, unspecified: Secondary | ICD-10-CM | POA: Diagnosis not present

## 2017-12-26 DIAGNOSIS — E559 Vitamin D deficiency, unspecified: Secondary | ICD-10-CM | POA: Insufficient documentation

## 2017-12-26 DIAGNOSIS — Z8249 Family history of ischemic heart disease and other diseases of the circulatory system: Secondary | ICD-10-CM | POA: Insufficient documentation

## 2017-12-26 DIAGNOSIS — Z79899 Other long term (current) drug therapy: Secondary | ICD-10-CM | POA: Insufficient documentation

## 2017-12-26 DIAGNOSIS — Z853 Personal history of malignant neoplasm of breast: Secondary | ICD-10-CM | POA: Diagnosis not present

## 2017-12-26 DIAGNOSIS — Z833 Family history of diabetes mellitus: Secondary | ICD-10-CM | POA: Diagnosis not present

## 2017-12-26 DIAGNOSIS — I4581 Long QT syndrome: Secondary | ICD-10-CM | POA: Diagnosis not present

## 2017-12-26 DIAGNOSIS — E119 Type 2 diabetes mellitus without complications: Secondary | ICD-10-CM | POA: Insufficient documentation

## 2017-12-26 DIAGNOSIS — L409 Psoriasis, unspecified: Secondary | ICD-10-CM | POA: Insufficient documentation

## 2017-12-26 DIAGNOSIS — Z888 Allergy status to other drugs, medicaments and biological substances status: Secondary | ICD-10-CM | POA: Insufficient documentation

## 2017-12-26 DIAGNOSIS — I10 Essential (primary) hypertension: Secondary | ICD-10-CM | POA: Diagnosis not present

## 2017-12-26 DIAGNOSIS — D060 Carcinoma in situ of endocervix: Secondary | ICD-10-CM | POA: Insufficient documentation

## 2017-12-26 DIAGNOSIS — R87810 Cervical high risk human papillomavirus (HPV) DNA test positive: Secondary | ICD-10-CM | POA: Diagnosis present

## 2017-12-26 DIAGNOSIS — N649 Disorder of breast, unspecified: Secondary | ICD-10-CM | POA: Diagnosis present

## 2017-12-26 HISTORY — PX: CERVICAL CONIZATION W/BX: SHX1330

## 2017-12-26 HISTORY — DX: Age-related osteoporosis without current pathological fracture: M81.0

## 2017-12-26 HISTORY — DX: Psoriasis, unspecified: L40.9

## 2017-12-26 HISTORY — DX: Adverse effect of unspecified anesthetic, initial encounter: T41.45XA

## 2017-12-26 HISTORY — DX: Seborrheic dermatitis, unspecified: L21.9

## 2017-12-26 HISTORY — DX: Nonscarring hair loss, unspecified: L65.9

## 2017-12-26 HISTORY — DX: Spondylosis without myelopathy or radiculopathy, thoracic region: M47.814

## 2017-12-26 HISTORY — DX: Disorder of breast, unspecified: N64.9

## 2017-12-26 HISTORY — DX: Other complications of anesthesia, initial encounter: T88.59XA

## 2017-12-26 HISTORY — DX: Dermatitis, unspecified: L30.9

## 2017-12-26 LAB — POCT I-STAT 4, (NA,K, GLUC, HGB,HCT)
GLUCOSE: 122 mg/dL — AB (ref 65–99)
HCT: 40 % (ref 36.0–46.0)
Hemoglobin: 13.6 g/dL (ref 12.0–15.0)
POTASSIUM: 3.7 mmol/L (ref 3.5–5.1)
Sodium: 142 mmol/L (ref 135–145)

## 2017-12-26 LAB — GLUCOSE, CAPILLARY: Glucose-Capillary: 121 mg/dL — ABNORMAL HIGH (ref 65–99)

## 2017-12-26 SURGERY — CONE BIOPSY, CERVIX
Anesthesia: General | Site: Cervix

## 2017-12-26 MED ORDER — EPHEDRINE SULFATE-NACL 50-0.9 MG/10ML-% IV SOSY
PREFILLED_SYRINGE | INTRAVENOUS | Status: DC | PRN
  Administered 2017-12-26 (×2): 10 mg via INTRAVENOUS

## 2017-12-26 MED ORDER — LIDOCAINE 2% (20 MG/ML) 5 ML SYRINGE
INTRAMUSCULAR | Status: AC
Start: 2017-12-26 — End: 2017-12-26
  Filled 2017-12-26: qty 5

## 2017-12-26 MED ORDER — MIDAZOLAM HCL 5 MG/5ML IJ SOLN
INTRAMUSCULAR | Status: DC | PRN
  Administered 2017-12-26: 2 mg via INTRAVENOUS

## 2017-12-26 MED ORDER — ONDANSETRON HCL 4 MG/2ML IJ SOLN
INTRAMUSCULAR | Status: DC | PRN
  Administered 2017-12-26: 4 mg via INTRAVENOUS

## 2017-12-26 MED ORDER — FENTANYL CITRATE (PF) 100 MCG/2ML IJ SOLN
INTRAMUSCULAR | Status: DC | PRN
  Administered 2017-12-26: 50 ug via INTRAVENOUS

## 2017-12-26 MED ORDER — FENTANYL CITRATE (PF) 100 MCG/2ML IJ SOLN
INTRAMUSCULAR | Status: AC
Start: 2017-12-26 — End: 2017-12-26
  Filled 2017-12-26: qty 2

## 2017-12-26 MED ORDER — DEXAMETHASONE SODIUM PHOSPHATE 4 MG/ML IJ SOLN
INTRAMUSCULAR | Status: DC | PRN
  Administered 2017-12-26: 10 mg via INTRAVENOUS

## 2017-12-26 MED ORDER — VASOPRESSIN 20 UNIT/ML IV SOLN
INTRAVENOUS | Status: DC | PRN
  Administered 2017-12-26: 08:00:00 via INTRAMUSCULAR

## 2017-12-26 MED ORDER — PROPOFOL 10 MG/ML IV BOLUS
INTRAVENOUS | Status: AC
  Filled 2017-12-26: qty 40

## 2017-12-26 MED ORDER — KETOROLAC TROMETHAMINE 30 MG/ML IJ SOLN
INTRAMUSCULAR | Status: DC | PRN
  Administered 2017-12-26: 30 mg via INTRAMUSCULAR
  Administered 2017-12-26: 30 mg via INTRAVENOUS

## 2017-12-26 MED ORDER — FENTANYL CITRATE (PF) 100 MCG/2ML IJ SOLN
INTRAMUSCULAR | Status: AC
  Filled 2017-12-26: qty 2

## 2017-12-26 MED ORDER — LACTATED RINGERS IV SOLN
INTRAVENOUS | Status: DC
  Administered 2017-12-26 (×2): via INTRAVENOUS
  Filled 2017-12-26: qty 1000

## 2017-12-26 MED ORDER — PROMETHAZINE HCL 25 MG/ML IJ SOLN
6.2500 mg | INTRAMUSCULAR | Status: DC | PRN
  Filled 2017-12-26: qty 1

## 2017-12-26 MED ORDER — LIDOCAINE 2% (20 MG/ML) 5 ML SYRINGE
INTRAMUSCULAR | Status: DC | PRN
  Administered 2017-12-26: 60 mg via INTRAVENOUS

## 2017-12-26 MED ORDER — EPHEDRINE 5 MG/ML INJ
INTRAVENOUS | Status: AC
  Filled 2017-12-26: qty 10

## 2017-12-26 MED ORDER — 0.9 % SODIUM CHLORIDE (POUR BTL) OPTIME
TOPICAL | Status: DC | PRN
  Administered 2017-12-26: 500 mL

## 2017-12-26 MED ORDER — DEXAMETHASONE SODIUM PHOSPHATE 10 MG/ML IJ SOLN
INTRAMUSCULAR | Status: AC
  Filled 2017-12-26: qty 1

## 2017-12-26 MED ORDER — LIDOCAINE HCL 2 % IJ SOLN
INTRAMUSCULAR | Status: DC | PRN
  Administered 2017-12-26: 20 mL

## 2017-12-26 MED ORDER — FENTANYL CITRATE (PF) 100 MCG/2ML IJ SOLN
25.0000 ug | INTRAMUSCULAR | Status: DC | PRN
  Administered 2017-12-26: 25 ug via INTRAVENOUS
  Filled 2017-12-26: qty 1

## 2017-12-26 MED ORDER — ONDANSETRON HCL 4 MG/2ML IJ SOLN
INTRAMUSCULAR | Status: AC
  Filled 2017-12-26: qty 2

## 2017-12-26 MED ORDER — KETOROLAC TROMETHAMINE 30 MG/ML IJ SOLN
INTRAMUSCULAR | Status: AC
  Filled 2017-12-26: qty 2

## 2017-12-26 MED ORDER — IBUPROFEN 600 MG PO TABS: ORAL_TABLET | ORAL | 0 refills | Status: DC

## 2017-12-26 MED ORDER — MIDAZOLAM HCL 2 MG/2ML IJ SOLN
INTRAMUSCULAR | Status: AC
  Filled 2017-12-26: qty 2

## 2017-12-26 MED ORDER — IODINE STRONG (LUGOLS) 5 % PO SOLN
ORAL | Status: DC | PRN
  Administered 2017-12-26: 0.2 mL

## 2017-12-26 MED ORDER — PROPOFOL 10 MG/ML IV BOLUS
INTRAVENOUS | Status: DC | PRN
  Administered 2017-12-26: 150 mg via INTRAVENOUS

## 2017-12-26 SURGICAL SUPPLY — 32 items
APPLICATOR COTTON TIP 6IN STRL (MISCELLANEOUS) ×3 IMPLANT
BLADE SURG 11 STRL SS (BLADE) ×2 IMPLANT
CATH ROBINSON RED A/P 16FR (CATHETERS) ×1 IMPLANT
ELECT BALL LEEP 3MM BLK (ELECTRODE) IMPLANT
ELECT BALL LEEP 5MM RED (ELECTRODE) IMPLANT
ELECT LOOP LEEP RND 15X12 GRN (CUTTING LOOP)
ELECT LOOP LEEP RND 20X12 WHT (CUTTING LOOP)
ELECT REM PT RETURN 9FT ADLT (ELECTROSURGICAL) ×2
ELECTRODE LOOP LP RND 15X12GRN (CUTTING LOOP) IMPLANT
ELECTRODE LOOP LP RND 20X12WHT (CUTTING LOOP) IMPLANT
ELECTRODE REM PT RTRN 9FT ADLT (ELECTROSURGICAL) IMPLANT
GLOVE BIO SURGEON STRL SZ 6.5 (GLOVE) ×1 IMPLANT
GLOVE BIO SURGEON STRL SZ7.5 (GLOVE) ×1 IMPLANT
GLOVE BIOGEL M 6.5 STRL (GLOVE) ×1 IMPLANT
GLOVE BIOGEL M STRL SZ7.5 (GLOVE) ×1 IMPLANT
GLOVE SURG SS PI 6.5 STRL IVOR (GLOVE) ×4 IMPLANT
GOWN STRL REUS W/TWL LRG LVL3 (GOWN DISPOSABLE) ×5 IMPLANT
KIT RM TURNOVER CYSTO AR (KITS) ×2 IMPLANT
NS IRRIG 500ML POUR BTL (IV SOLUTION) ×1 IMPLANT
PACK VAGINAL WOMENS (CUSTOM PROCEDURE TRAY) ×2 IMPLANT
PAD OB MATERNITY 4.3X12.25 (PERSONAL CARE ITEMS) ×2 IMPLANT
PENCIL BUTTON HOLSTER BLD 10FT (ELECTRODE) ×1 IMPLANT
SCOPETTES 8  STERILE (MISCELLANEOUS) ×1
SCOPETTES 8 STERILE (MISCELLANEOUS) ×1 IMPLANT
SPONGE SURGIFOAM ABS GEL 12-7 (HEMOSTASIS) ×1 IMPLANT
SUT VIC AB 0 CT1 18XCR BRD8 (SUTURE) ×1 IMPLANT
SUT VIC AB 0 CT1 8-18 (SUTURE) ×2
SUT VIC AB 0 CT2 27 (SUTURE) IMPLANT
SYR CONTROL 10ML LL (SYRINGE) ×3 IMPLANT
SYR TB 1ML 27GX1/2 SAFE (SYRINGE) IMPLANT
SYR TB 1ML 27GX1/2 SAFETY (SYRINGE)
TOWEL OR 17X24 6PK STRL BLUE (TOWEL DISPOSABLE) ×4 IMPLANT

## 2017-12-26 NOTE — Discharge Instructions (Signed)
Cervical Conization °Cervical conization (cone biopsy) is a procedure in which a cone-shaped portion of the cervix is cut out so that it can be examined under a microscope. The procedure is done to check for cancer cells or cells that might turn into cancer (precancerous cells). You may have this procedure if: °· You have abnormal bleeding from your cervix. °· You had an abnormal Pap test. °· Something abnormal was seen on your cervix during an exam. ° °This procedure is performed in either a health care provider’s office or in an operating room. °Tell a health care provider about: °· Any allergies you have. °· All medicines you are taking, including vitamins, herbs, eye drops, creams, and over-the-counter medicines. °· Any problems you or family members have had with the use of anesthetic medicines. °· Any blood disorders you have. °· Any surgeries you have had. °· Any medical conditions you have. °· Your smoking habits. °· When you normally have your period. °· Whether you are pregnant or may be pregnant. °What are the risks? °Generally, this is a safe procedure. However, problems may occur, including: °· Heavy bleeding for several days or weeks after the procedure. °· Allergic reactions to medicines or dyes. °· Increased risk of preterm labor in future pregnancies. °· Infection (rare). °· Damage to the cervix or other structures or organs (rare). ° °What happens before the procedure? °Staying hydrated °Follow instructions from your health care provider about hydration, which may include: °· Up to 2 hours before the procedure - you may continue to drink clear liquids, such as water, clear fruit juice, black coffee, and plain tea. ° °Eating and drinking restrictions °Follow instructions from your health care provider about eating and drinking, which may include: °· 8 hours before the procedure - stop eating heavy meals or foods such as meat, fried foods, or fatty foods. °· 6 hours before the procedure - stop eating  light meals or foods, such as toast or cereal. °· 6 hours before the procedure - stop drinking milk or drinks that contain milk. °· 2 hours before the procedure - stop drinking clear liquids. ° °General instructions °· Do not douche, have sex, use tampons, or use any vaginal medicines before the procedure as told by your health care provider. °· You may be asked to empty your bladder and bowel right before the procedure. °· Ask your health care provider about: °? Changing or stopping your normal medicines. This is important if you take diabetes medicines or blood thinners. °? Taking medicines such as aspirin and ibuprofen. These medicines can thin your blood. Do not take these medicines before your procedure if your doctor tells you not to. °· Plan to have someone take you home from the hospital or clinic. °What happens during the procedure? °· To reduce your risk of infection: °? Your health care team will wash or sanitize their hands. °? Your skin will be washed with soap. °? Hair may be removed from the surgical area. °· You will undress from the waist down and be given a gown to wear. °· You will lie on an examining table and put your feet in stirrups. °· An IV tube will be inserted into one of your veins. °· You will be given one or more of the following: °? A medicine to help you relax (sedative). °? A medicine to numb the area (local anesthetic). °? A medicine to make you fall asleep (general anesthetic). °? A medicine that numbs the cervix (cervical block). °·   A lubricated device called a speculum will be inserted into your vagina. It will be used to spread open the walls of the vagina so your health care provider can see the inside of the vagina and cervix better.  An instrument that has a magnifying lens and a light (colposcope) will let your health care provider examine the cervix more closely.  Your health care provider will apply a solution to your cervix. This turns abnormal areas a pale  color.  A tissue sample will be removed from the cervix using one of the following methods: ? The cold knife method. In this method, the tissue is cut out with a knife (scalpel). ? The loop electrosurgical excision procedure (LEEP) method. In this method, the tissue is cut out with a thin wire that can burn (cauterize) the tissue with an electrical current. ? Laser treatment method. In this method, the tissue is cut out and then cauterized with a laser beam to prevent bleeding.  Your health care provider will apply a paste over the biopsy areas to help control bleeding.  The tissue sample will be examined under a microscope. The procedure may vary among health care providers and hospitals. What happens after the procedure?  Your blood pressure, heart rate, breathing rate, and blood oxygen level will be monitored often until the medicines you were given have worn off.  If you were given a local anesthetic, you will rest at the clinic or hospital until you are stable and feel ready to go home.  If you were given a general anesthetic, you may be monitored for a longer period of time.  You may have some cramping.  You may have bloody discharge or light to moderate bleeding.  You may have dark discharge coming from your vagina. This is from the paste used on the cervix to prevent bleeding. Summary  Cervical conization is a procedure in which a cone-shaped portion of the cervix is cut out so that it can be examined under a microscope.  The procedure is done to check for cancer cells or cells that might turn into cancer (precancerous cells). This information is not intended to replace advice given to you by your health care provider. Make sure you discuss any questions you have with your health care provider. Document Released: 08/28/2005 Document Revised: 11/20/2016 Document Reviewed: 11/20/2016 Elsevier Interactive Patient Education  2017 [facility]  Instructions  Activity: Get plenty of rest for the remainder of the day. A responsible individual must stay with you for 24 hours following the procedure.  For the next 24 hours, DO NOT: -Drive a car -Paediatric nurse -Drink alcoholic beverages -Take any medication unless instructed by your physician -Make any legal decisions or sign important papers.  Meals: Start with liquid foods such as gelatin or soup. Progress to regular foods as tolerated. Avoid greasy, spicy, heavy foods. If nausea and/or vomiting occur, drink only clear liquids until the nausea and/or vomiting subsides. Call your physician if vomiting continues.  Special Instructions/Symptoms: Your throat may feel dry or sore from the anesthesia or the breathing tube placed in your throat during surgery. If this causes discomfort, gargle with warm salt water. The discomfort should disappear within 24 hours.  If you had a scopolamine patch placed behind your ear for the management of post- operative nausea and/or vomiting:  1. The medication in the patch is effective for 72 hours, after which it should be removed.  Wrap patch in a tissue and  discard in the trash. Wash hands thoroughly with soap and water. 2. You may remove the patch earlier than 72 hours if you experience unpleasant side effects which may include dry mouth, dizziness or visual disturbances. 3. Avoid touching the patch. Wash your hands with soap and water after contact with the patch.

## 2017-12-26 NOTE — Anesthesia Postprocedure Evaluation (Signed)
Anesthesia Post Note  Patient: Sharon Montgomery  Procedure(s) Performed: CONIZATION CERVIX WITH BIOPSY (N/A Cervix)     Patient location during evaluation: PACU Anesthesia Type: General Level of consciousness: awake and alert Pain management: pain level controlled Vital Signs Assessment: post-procedure vital signs reviewed and stable Respiratory status: spontaneous breathing, nonlabored ventilation, respiratory function stable and patient connected to nasal cannula oxygen Cardiovascular status: blood pressure returned to baseline and stable Postop Assessment: no apparent nausea or vomiting Anesthetic complications: no    Last Vitals:  Vitals:   12/26/17 0858 12/26/17 0900  BP:  (!) 153/74  Pulse: 80 73  Resp: 19 18  Temp:    SpO2: 100% 98%    Last Pain:  Vitals:   12/26/17 0910  TempSrc:   PainSc: 5                  Selin Eisler S

## 2017-12-26 NOTE — Transfer of Care (Signed)
  Last Vitals:  Vitals:   12/26/17 0622  BP: (!) 163/77  Pulse: 88  Resp: 18  Temp: 36.4 C  SpO2: 100%    Last Pain:  Vitals:   12/26/17 0622  TempSrc: Oral      Patients Stated Pain Goal: 5 (12/26/17 6811)  Immediate Anesthesia Transfer of Care Note  Patient: Sharon Montgomery  Procedure(s) Performed: Procedure(s) (LRB): CONIZATION CERVIX WITH BIOPSY (N/A)  Patient Location: PACU  Anesthesia Type: General  Level of Consciousness: awake, alert  and oriented  Airway & Oxygen Therapy: Patient Spontanous Breathing and Patient connected to nasal cannula oxygen  Post-op Assessment: Report given to PACU RN and Post -op Vital signs reviewed and stable  Post vital signs: Reviewed and stable  Complications: No apparent anesthesia complications

## 2017-12-26 NOTE — Anesthesia Preprocedure Evaluation (Signed)
Anesthesia Evaluation  Patient identified by MRN, date of birth, ID band Patient awake    Reviewed: Allergy & Precautions, NPO status , Patient's Chart, lab work & pertinent test results  Airway Mallampati: II  TM Distance: >3 FB Neck ROM: Full    Dental no notable dental hx.    Pulmonary neg pulmonary ROS,    Pulmonary exam normal breath sounds clear to auscultation       Cardiovascular hypertension, Normal cardiovascular exam Rhythm:Regular Rate:Normal     Neuro/Psych negative neurological ROS  negative psych ROS   GI/Hepatic negative GI ROS, Neg liver ROS,   Endo/Other  diabetes, Type 2  Renal/GU negative Renal ROS  negative genitourinary   Musculoskeletal negative musculoskeletal ROS (+)   Abdominal   Peds negative pediatric ROS (+)  Hematology negative hematology ROS (+)   Anesthesia Other Findings   Reproductive/Obstetrics negative OB ROS                             Anesthesia Physical Anesthesia Plan  ASA: II  Anesthesia Plan: General   Post-op Pain Management:    Induction: Intravenous  PONV Risk Score and Plan: 3 and Ondansetron, Dexamethasone and Treatment may vary due to age or medical condition  Airway Management Planned: LMA  Additional Equipment:   Intra-op Plan:   Post-operative Plan: Extubation in OR  Informed Consent: I have reviewed the patients History and Physical, chart, labs and discussed the procedure including the risks, benefits and alternatives for the proposed anesthesia with the patient or authorized representative who has indicated his/her understanding and acceptance.   Dental advisory given  Plan Discussed with: CRNA and Surgeon  Anesthesia Plan Comments:         Anesthesia Quick Evaluation

## 2017-12-26 NOTE — Anesthesia Procedure Notes (Signed)
Procedure Name: LMA Insertion Date/Time: 12/26/2017 7:28 AM Performed by: Myrtie Soman, MD Pre-anesthesia Checklist: Patient identified, Emergency Drugs available, Suction available and Patient being monitored Patient Re-evaluated:Patient Re-evaluated prior to induction Oxygen Delivery Method: Circle system utilized Preoxygenation: Pre-oxygenation with 100% oxygen Induction Type: IV induction Ventilation: Mask ventilation without difficulty LMA: LMA inserted LMA Size: 4.0 Number of attempts: 1 Airway Equipment and Method: Bite block Placement Confirmation: positive ETCO2 Tube secured with: Tape Dental Injury: Teeth and Oropharynx as per pre-operative assessment

## 2017-12-26 NOTE — Op Note (Signed)
Procedure(s): CONIZATION CERVIX WITH BIOPSY Procedure Note  Sharon Montgomery female 62 y.o. 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  The cervix was atrophic with the posterior cervix being flush with the posterior vaginal fornix.  Minimal tissue obtained on ECC and endometrial currettings.   Estimated Blood Loss:  Minimal         Drains: none           Blood Given: none          Specimens:  1) conization of cervix with stitch at 12 o'clock   2) endocervical currettings   3) endometrial currettings    Procedure: The patient was taken to the operating room after appropriate identification placed on the operating table. After the attainment of adequate general anesthesia she was placed in the lithotomy position. The perineum was prepped with multiple layers of Betadine and draped as a sterile field. A time out was performed.  The introitus was cleaned with Betadine and the vaginal walls gently cleaned with Betadine. The Graves speculum was placed. Cervix clean with Betadine wash. The cervix was grasped outside the transition zone with a tenaculum and a dilute solution of Pitressin used to infiltrate the entire cervix. A paracervical block was achieved with a total of 10 cc of 1% Xylocaine plain. Lugol stain was applied to the cervix and there were no Lugol's negative areas. The uterus was sounded to 7 cm. The endocervical canal approximately 2 cm. Anchor sutures were placed at the 3 and 9:00 positions and tied down and held. A cone shaped specimen incorporating the endocervical canal  was excised, marked with a suture at 12:00 and removed from the operative field. The endocervical area was then curetted producing minimal tissue. The endometrium was then curetted tissue. The cone bed was closed with Sturmdorf sutures at 12 and 6:00. Running sutures at the cut edge near 3 and 9:00 achieved excellent hemostasis. A portion of Gelfoam wet with Pitressin was placed in the conization bed and hemostasis was noted to be adequate. All instruments were removed from the vagina and the patient was awakened from general anesthesia and taken to the recovery room in satisfactory condition having tolerated procedure well with sponge and instrument counts correct.                     Complications:  none         Disposition: PACU - hemodynamically stable.         Condition: stable

## 2017-12-29 ENCOUNTER — Encounter (HOSPITAL_BASED_OUTPATIENT_CLINIC_OR_DEPARTMENT_OTHER): Payer: Self-pay | Admitting: Obstetrics and Gynecology

## 2018-09-09 ENCOUNTER — Other Ambulatory Visit: Payer: Self-pay | Admitting: Obstetrics and Gynecology

## 2018-09-09 DIAGNOSIS — Z1231 Encounter for screening mammogram for malignant neoplasm of breast: Secondary | ICD-10-CM

## 2018-12-11 ENCOUNTER — Other Ambulatory Visit: Payer: Self-pay | Admitting: Obstetrics and Gynecology

## 2018-12-18 ENCOUNTER — Other Ambulatory Visit: Payer: Self-pay | Admitting: Obstetrics and Gynecology

## 2018-12-18 DIAGNOSIS — M810 Age-related osteoporosis without current pathological fracture: Secondary | ICD-10-CM

## 2019-02-05 ENCOUNTER — Other Ambulatory Visit: Payer: Managed Care, Other (non HMO)

## 2019-08-02 ENCOUNTER — Other Ambulatory Visit: Payer: Self-pay | Admitting: Obstetrics and Gynecology

## 2019-08-02 DIAGNOSIS — Z1231 Encounter for screening mammogram for malignant neoplasm of breast: Secondary | ICD-10-CM

## 2019-09-07 ENCOUNTER — Other Ambulatory Visit: Payer: Self-pay

## 2019-09-07 ENCOUNTER — Ambulatory Visit
Admission: RE | Admit: 2019-09-07 | Discharge: 2019-09-07 | Disposition: A | Discharge status: Home / Self Care | Payer: Managed Care, Other (non HMO) | Source: Ambulatory Visit | Attending: Obstetrics and Gynecology | Admitting: Obstetrics and Gynecology

## 2019-09-07 DIAGNOSIS — M810 Age-related osteoporosis without current pathological fracture: Secondary | ICD-10-CM

## 2019-09-07 DIAGNOSIS — Z1231 Encounter for screening mammogram for malignant neoplasm of breast: Secondary | ICD-10-CM

## 2019-09-15 ENCOUNTER — Ambulatory Visit: Payer: Managed Care, Other (non HMO)

## 2020-02-04 ENCOUNTER — Other Ambulatory Visit: Payer: Self-pay | Admitting: Obstetrics and Gynecology

## 2020-02-04 DIAGNOSIS — Z1231 Encounter for screening mammogram for malignant neoplasm of breast: Secondary | ICD-10-CM

## 2020-09-07 ENCOUNTER — Ambulatory Visit
Admission: RE | Admit: 2020-09-07 | Discharge: 2020-09-07 | Disposition: A | Discharge status: Home / Self Care | Payer: Managed Care, Other (non HMO) | Source: Ambulatory Visit | Attending: Obstetrics and Gynecology | Admitting: Obstetrics and Gynecology

## 2020-09-07 ENCOUNTER — Other Ambulatory Visit: Payer: Self-pay

## 2020-09-07 ENCOUNTER — Other Ambulatory Visit: Payer: Self-pay | Admitting: Obstetrics and Gynecology

## 2020-09-07 DIAGNOSIS — Z1231 Encounter for screening mammogram for malignant neoplasm of breast: Secondary | ICD-10-CM

## 2020-09-07 HISTORY — DX: Malignant neoplasm of unspecified site of unspecified female breast: C50.919

## 2020-12-26 ENCOUNTER — Other Ambulatory Visit: Payer: Self-pay | Admitting: Pharmacist

## 2020-12-26 DIAGNOSIS — Z0000 Encounter for general adult medical examination without abnormal findings: Secondary | ICD-10-CM

## 2021-07-25 ENCOUNTER — Other Ambulatory Visit: Payer: Self-pay | Admitting: Internal Medicine

## 2021-07-25 DIAGNOSIS — E041 Nontoxic single thyroid nodule: Secondary | ICD-10-CM

## 2021-08-07 ENCOUNTER — Ambulatory Visit
Admission: RE | Admit: 2021-08-07 | Discharge: 2021-08-07 | Disposition: A | Discharge status: Home / Self Care | Payer: 59 | Source: Ambulatory Visit | Attending: Internal Medicine | Admitting: Internal Medicine

## 2021-08-07 DIAGNOSIS — E041 Nontoxic single thyroid nodule: Secondary | ICD-10-CM

## 2021-09-07 ENCOUNTER — Ambulatory Visit
Admission: RE | Admit: 2021-09-07 | Discharge: 2021-09-07 | Disposition: A | Discharge status: Home / Self Care | Payer: 59 | Source: Ambulatory Visit | Attending: Family Medicine | Admitting: Family Medicine

## 2021-09-07 ENCOUNTER — Other Ambulatory Visit: Payer: Self-pay

## 2021-09-07 ENCOUNTER — Other Ambulatory Visit: Payer: Self-pay | Admitting: Pharmacist

## 2021-09-07 DIAGNOSIS — Z0000 Encounter for general adult medical examination without abnormal findings: Secondary | ICD-10-CM

## 2021-12-04 ENCOUNTER — Other Ambulatory Visit (HOSPITAL_COMMUNITY): Payer: Self-pay

## 2021-12-04 MED ORDER — OZEMPIC (1 MG/DOSE) 4 MG/3ML ~~LOC~~ SOPN
PEN_INJECTOR | SUBCUTANEOUS | 2 refills | Status: AC
  Filled 2021-12-04 (×2): qty 3, 28d supply, fill #0
  Filled 2021-12-04: qty 9, 84d supply, fill #0
  Filled 2021-12-04: qty 3, 28d supply, fill #0

## 2021-12-05 ENCOUNTER — Other Ambulatory Visit (HOSPITAL_COMMUNITY): Payer: Self-pay

## 2021-12-28 ENCOUNTER — Ambulatory Visit: Payer: 59 | Admitting: Internal Medicine

## 2021-12-28 ENCOUNTER — Encounter: Payer: Self-pay | Admitting: Internal Medicine

## 2021-12-28 ENCOUNTER — Other Ambulatory Visit: Payer: Self-pay

## 2021-12-28 VITALS — BP 142/80 | HR 86 | Ht 65.0 in | Wt 185.0 lb

## 2021-12-28 DIAGNOSIS — E010 Iodine-deficiency related diffuse (endemic) goiter: Secondary | ICD-10-CM

## 2021-12-28 NOTE — Progress Notes (Signed)
Name: Sharon Montgomery  MRN/ DOB: [id number], 11-05-1956    Age/ Sex: 66 y.o., female    PCP: Michael Boston, MD   Reason for Endocrinology Evaluation: Thyromegaly      Date of Initial Endocrinology Evaluation: 12/28/2021     HPI: [name] is a 66 y.o. female with a past medical history of T2DM, MNG, osteoporosis . The patient presented for initial endocrinology clinic visit on 12/28/2021 for consultative assistance with her Thyromegaly   She was noted to have thyromegaly during physician exam in 07/2021. This prompted a thyroid ultrasound showing hyperemic thyroid gland with sub-centimer nodule   The pt did not notice any swelling  Denies dysphagia nor pain    She has intentional weight loss  Denies diarrhea  Denies tremors  She is no Biotin     Sister has thyroid disease         HISTORY:  Past Medical History:  Past Medical History:  Diagnosis Date   Breast cancer ([place])    Breast disorder    breast cancer   Cancer ([place])    LEFT BREAST   Colon polyp 2008   adenomatous, colonoscopy every 5 years   Complication of anesthesia    itching   Diabetes mellitus    TYPE 2   Dyslipidemia    Eczema    Hair loss    History of breast cancer 2000   Hyperlipidemia    Hypertension    controlled with diet   Obesity    Osteoporosis    Psoriasis    Seborrheic dermatitis of scalp    Thoracic spondylosis 11/10/2015   mild   Vitamin D deficiency    Past Surgical History:  Past Surgical History:  Procedure Laterality Date   CERVICAL CONIZATION W/BX N/A 12/26/2017   Procedure: CONIZATION CERVIX WITH BIOPSY;  Surgeon: Eldred Manges, MD;  Location: [facility];  Service: Gynecology;  Laterality: N/A;   CESAREAN SECTION     COLONOSCOPY W/ POLYPECTOMY  2008   MASTECTOMY  1998   LEFT SIDE, lymph node removal   SHOULDER SURGERY     PIN PLACED IN RIGHT SHOULDER   TONSILLECTOMY      Social History:  reports that she has never smoked. She has never  used smokeless tobacco. She reports that she does not drink alcohol and does not use drugs. Family History: family history includes Diabetes in her maternal grandmother and mother; Hypertension in her father.   HOME MEDICATIONS: Allergies as of 12/28/2021       Reactions   Lisinopril Cough        Medication List        Accurate as of December 28, 2021  8:35 AM. If you have any questions, ask your nurse or doctor.          STOP taking these medications    Biotin 5000 MCG Caps Stopped by: Dorita Sciara, MD   ibuprofen 600 MG tablet Commonly known as: ADVIL Stopped by: Dorita Sciara, MD       TAKE these medications    aspirin 81 MG tablet Take 81 mg by mouth daily.   atorvastatin 20 MG tablet Commonly known as: LIPITOR 20 mg daily at 6 PM.   calcium carbonate 1500 (600 Ca) MG Tabs tablet Commonly known as: OSCAL 1 tablet with meals   Canagliflozin-metFORMIN HCl 50-1000 MG Tabs Commonly known as: Invokamet Take 1 tablet by mouth two  times daily with  food What changed: when to take this   CLARITIN-D 12 HOUR PO Take 1 tablet by mouth daily.   ergocalciferol 1.25 MG (50000 UT) capsule Commonly known as: VITAMIN D2 Take 1 capsule (50,000 Units total) by mouth once a week.   freestyle lancets Use as instructed to check blood sugar one time per day dx code 250.00   glucose blood test strip Commonly known as: FREESTYLE LITE Use as instructed to check blood sugar one time per day dx code 250.00   losartan 50 MG tablet Commonly known as: COZAAR   Ozempic (1 MG/DOSE) 4 MG/3ML Sopn Generic drug: Semaglutide (1 MG/DOSE) Inject 1 MG into the skin once a week          REVIEW OF SYSTEMS: A comprehensive ROS was conducted with the patient and is negative except as per HPI     OBJECTIVE:  VS: BP (!) 142/80 (BP Location: Left Arm, Patient Position: Sitting, Cuff Size: Small)    Pulse 86    Ht 5\' 5"  (1.651 m)    Wt 185 lb (83.9 kg)    SpO2  99%    BMI 30.79 kg/m    Wt Readings from Last 3 Encounters:  12/28/21 185 lb (83.9 kg)  12/26/17 191 lb 4.8 oz (86.8 kg)  01/22/16 219 lb 11.2 oz (99.7 kg)     EXAM: General: Pt appears well and is in NAD  Neck: General: Supple without adenopathy. Thyroid: Thyroid size normal.  No goiter or nodules appreciated.   Lungs: Clear with good BS bilat with no rales, rhonchi, or wheezes  Heart: Auscultation: RRR.  Abdomen: Normoactive bowel sounds, soft, nontender, without masses or organomegaly palpable  Extremities:  BL LE: No pretibial edema normal ROM and strength.  Mental Status: Judgment, insight: Intact Orientation: Oriented to time, place, and person Mood and affect: No depression, anxiety, or agitation     DATA REVIEWED:   Thyroid ultrasound 08/07/2021  Estimated total number of nodules >/= 1 cm: 0   Number of spongiform nodules >/=  2 cm not described below (TR1): 0   Number of mixed cystic and solid nodules >/= 1.5 cm not described below ([place]): 0   _________________________________________________________   No discrete nodules are seen within the thyroid gland.   Adjacent to the inferior pole the left lobe of the thyroid is an approximately 0.8 x 0.6 x 0 6 cm well-defined anechoic cyst/nodule.   IMPRESSION: 1. Potentially hyperemic but otherwise normal-appearing and sized thyroid without discrete nodule or mass. Findings are nonspecific though could be seen in the setting of an acute thyroiditis. Clinical correlation is advised. 2. Subcentimeter anechoic cyst/nodule adjacent to the inferior pole the left lobe of the thyroid, potentially a parathyroid adenoma though conceivably a fourth branchial cleft cyst could have a similar appearance. Clinical correlation is advised. Further evaluation with nuclear medicine parathyroid scintigraphy and/or contrast-enhanced neck CT could be performed as indicated.      ASSESSMENT/PLAN/RECOMMENDATIONS:    Thyromegaly:  -Based on her thyroid ultrasound imaging it appears that the patient had thyroiditis. -We discussed the differential diagnosis of subacute thyroiditis versus subclinical Graves' disease -Her TSH was normal in 07/2021 -On today's exam her thyroid is normal and there is no evidence of bruits -I am unable to check her TFTs today because she is on biotin, patient has been advised to stop biotin 2 to 3 days prior to any future thyroid blood work, she will return next week for repeat labs as well as TR AB -  Reassurance provided today    Follow-up in 6 months  Signed electronically by: Mack Guise, MD  [facility]  [facility] [address] [place], [place] [address] Phone: [phone] FAX: [phone]   CC: Michael Boston, [place] [address] Phone: [phone] Fax: [phone]   Return to Endocrinology clinic as below: Future Appointments  Date Time Provider [place]  01/04/2022  8:30 AM [facility] None  06/28/2022  8:10 AM Tyge Somers, Melanie Crazier, MD [facility] None

## 2021-12-28 NOTE — Patient Instructions (Signed)
HOLD Biotin 2-3 days before any future thyroid blood work

## 2022-01-04 ENCOUNTER — Other Ambulatory Visit (INDEPENDENT_AMBULATORY_CARE_PROVIDER_SITE_OTHER): Payer: 59

## 2022-01-04 ENCOUNTER — Other Ambulatory Visit: Payer: Self-pay

## 2022-01-04 DIAGNOSIS — E010 Iodine-deficiency related diffuse (endemic) goiter: Secondary | ICD-10-CM

## 2022-01-05 LAB — TSH: TSH: 1.24 u[IU]/mL (ref 0.450–4.500)

## 2022-01-05 LAB — THYROTROPIN RECEPTOR AUTOABS: Thyrotropin Receptor Ab: <1.1 IU/L (ref 0.00–1.75)

## 2022-01-05 LAB — T4, FREE: Free T4: 1.39 ng/dL (ref 0.82–1.77)

## 2022-01-07 ENCOUNTER — Encounter: Payer: Self-pay | Admitting: Internal Medicine

## 2022-02-26 ENCOUNTER — Other Ambulatory Visit: Payer: Self-pay | Admitting: Obstetrics and Gynecology

## 2022-02-26 DIAGNOSIS — M8580 Other specified disorders of bone density and structure, unspecified site: Secondary | ICD-10-CM

## 2022-06-25 ENCOUNTER — Other Ambulatory Visit: Payer: Self-pay | Admitting: Internal Medicine

## 2022-06-25 DIAGNOSIS — Z1231 Encounter for screening mammogram for malignant neoplasm of breast: Secondary | ICD-10-CM

## 2022-06-28 ENCOUNTER — Ambulatory Visit: Payer: 59 | Admitting: Internal Medicine

## 2022-08-19 ENCOUNTER — Ambulatory Visit
Admission: RE | Admit: 2022-08-19 | Discharge: 2022-08-19 | Disposition: A | Discharge status: Home / Self Care | Payer: 59 | Source: Ambulatory Visit | Attending: Obstetrics and Gynecology | Admitting: Obstetrics and Gynecology

## 2022-08-19 DIAGNOSIS — M8580 Other specified disorders of bone density and structure, unspecified site: Secondary | ICD-10-CM

## 2022-09-09 ENCOUNTER — Ambulatory Visit: Payer: 59

## 2022-09-10 ENCOUNTER — Ambulatory Visit
Admission: RE | Admit: 2022-09-10 | Discharge: 2022-09-10 | Disposition: A | Discharge status: Home / Self Care | Payer: 59 | Source: Ambulatory Visit | Attending: Internal Medicine | Admitting: Internal Medicine

## 2022-09-10 DIAGNOSIS — Z1231 Encounter for screening mammogram for malignant neoplasm of breast: Secondary | ICD-10-CM

## 2022-09-12 ENCOUNTER — Other Ambulatory Visit: Payer: Self-pay | Admitting: Internal Medicine

## 2022-09-12 DIAGNOSIS — R928 Other abnormal and inconclusive findings on diagnostic imaging of breast: Secondary | ICD-10-CM

## 2022-09-18 ENCOUNTER — Ambulatory Visit
Admission: RE | Admit: 2022-09-18 | Discharge: 2022-09-18 | Disposition: A | Discharge status: Home / Self Care | Payer: 59 | Source: Ambulatory Visit | Attending: Internal Medicine | Admitting: Internal Medicine

## 2022-09-18 DIAGNOSIS — R928 Other abnormal and inconclusive findings on diagnostic imaging of breast: Secondary | ICD-10-CM

## 2023-03-28 IMAGING — MG MM DIGITAL SCREENING UNILAT*R* W/ TOMO W/ CAD
4 series · 4 of 12 positions shown · non-contrast
Comparison: Previous exam(s).

CLINICAL DATA: Screening.

EXAM:
DIGITAL SCREENING UNILATERAL RIGHT MAMMOGRAM WITH CAD AND
TOMOSYNTHESIS
TECHNIQUE: Right screening digital craniocaudal and mediolateral oblique
mammograms were obtained. Right screening digital breast
tomosynthesis was performed. The images were evaluated with
computer-aided detection.

[R MLO synth-2D]
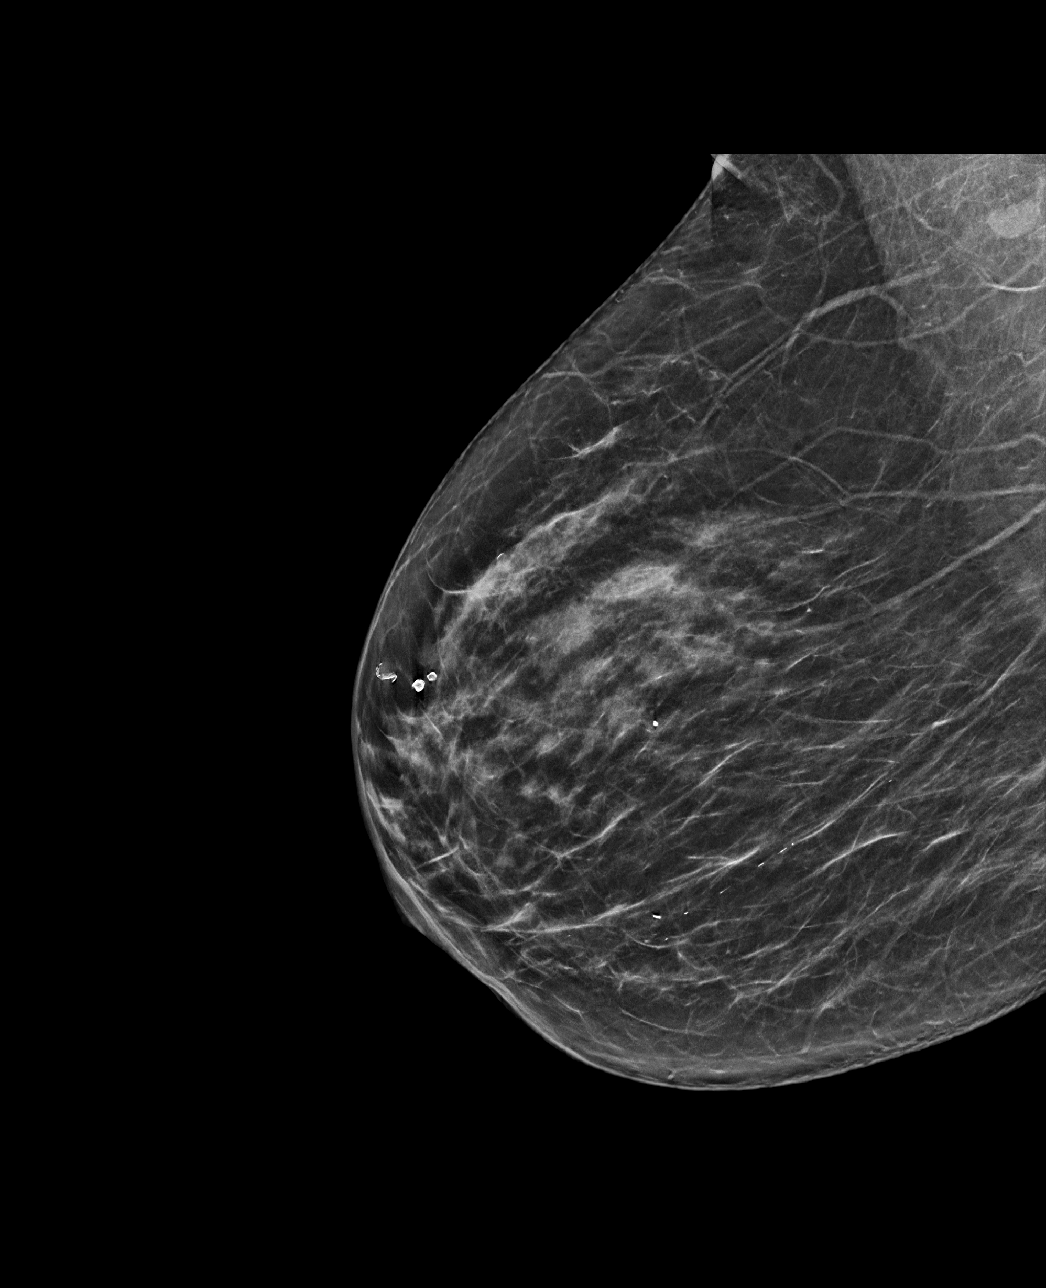

[R CC synth-2D]
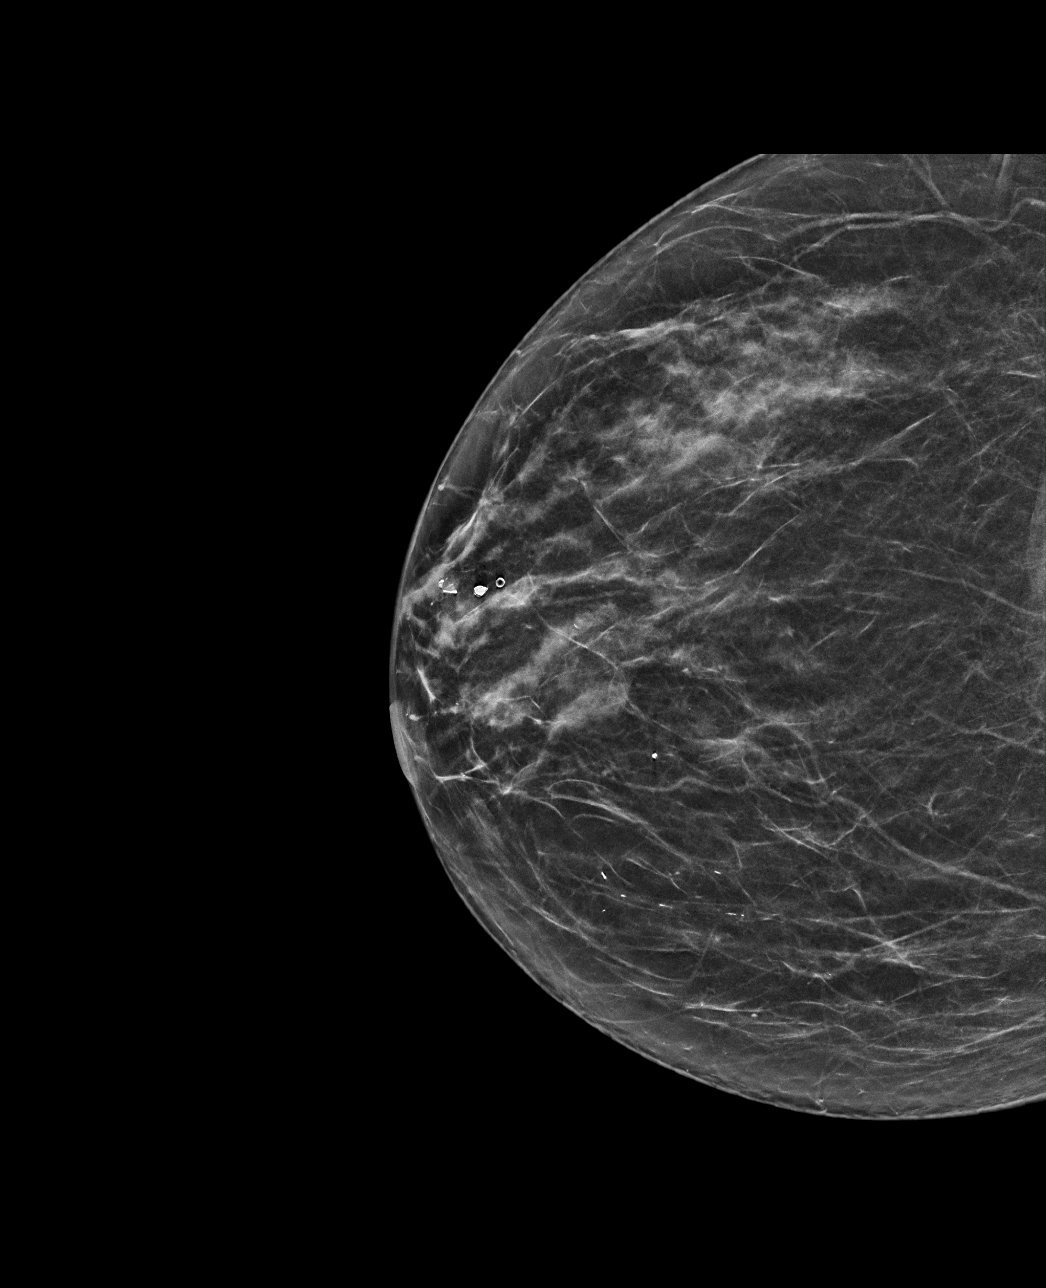

[R CC tomo · tomo slice 29/56.0]
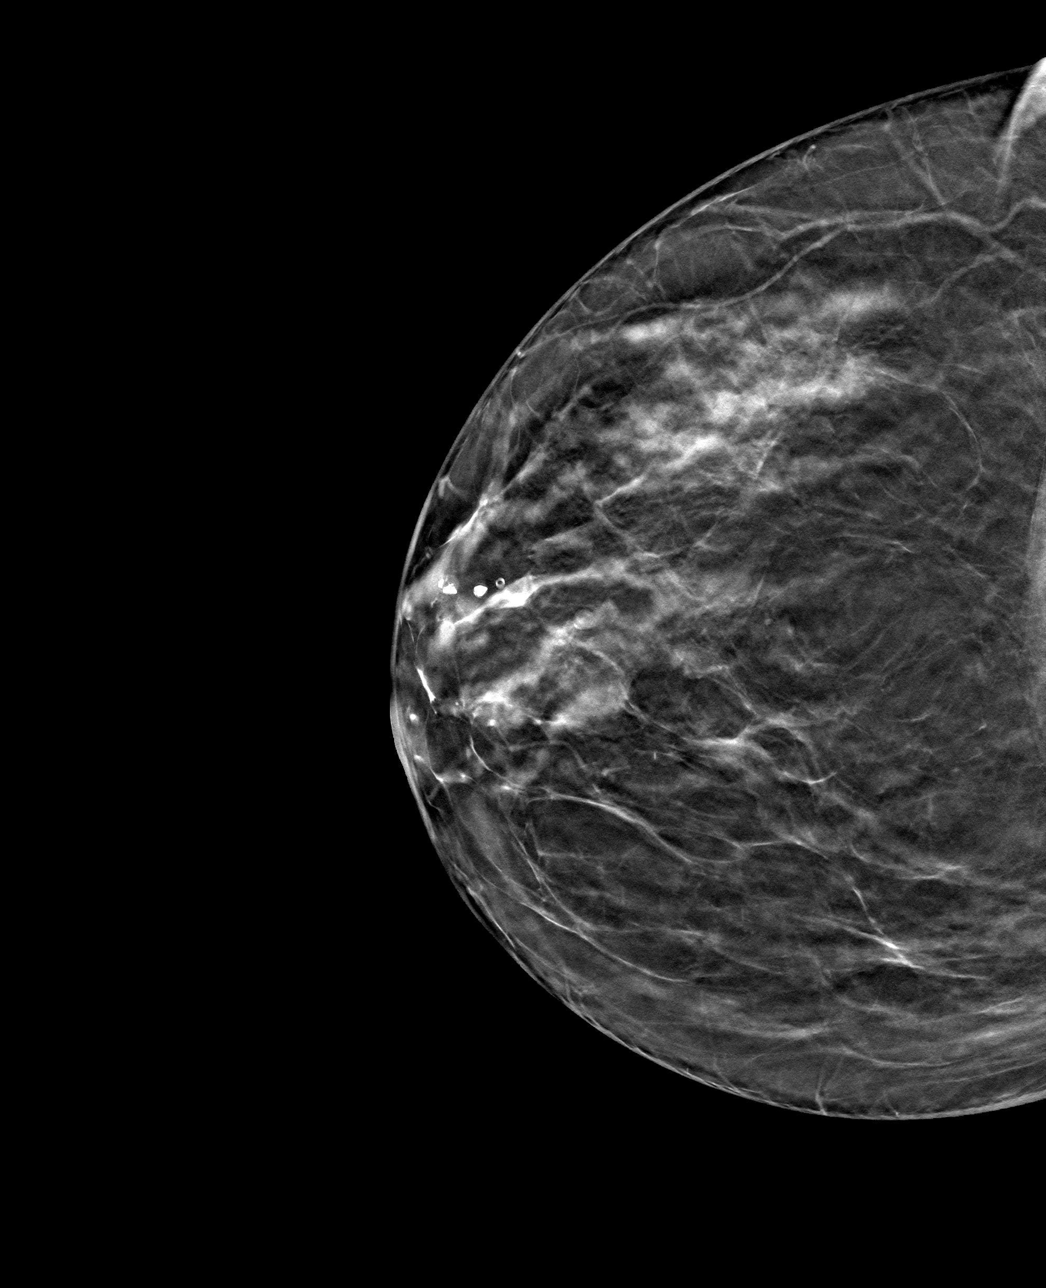

[R MLO tomo · tomo slice 33/64.0]
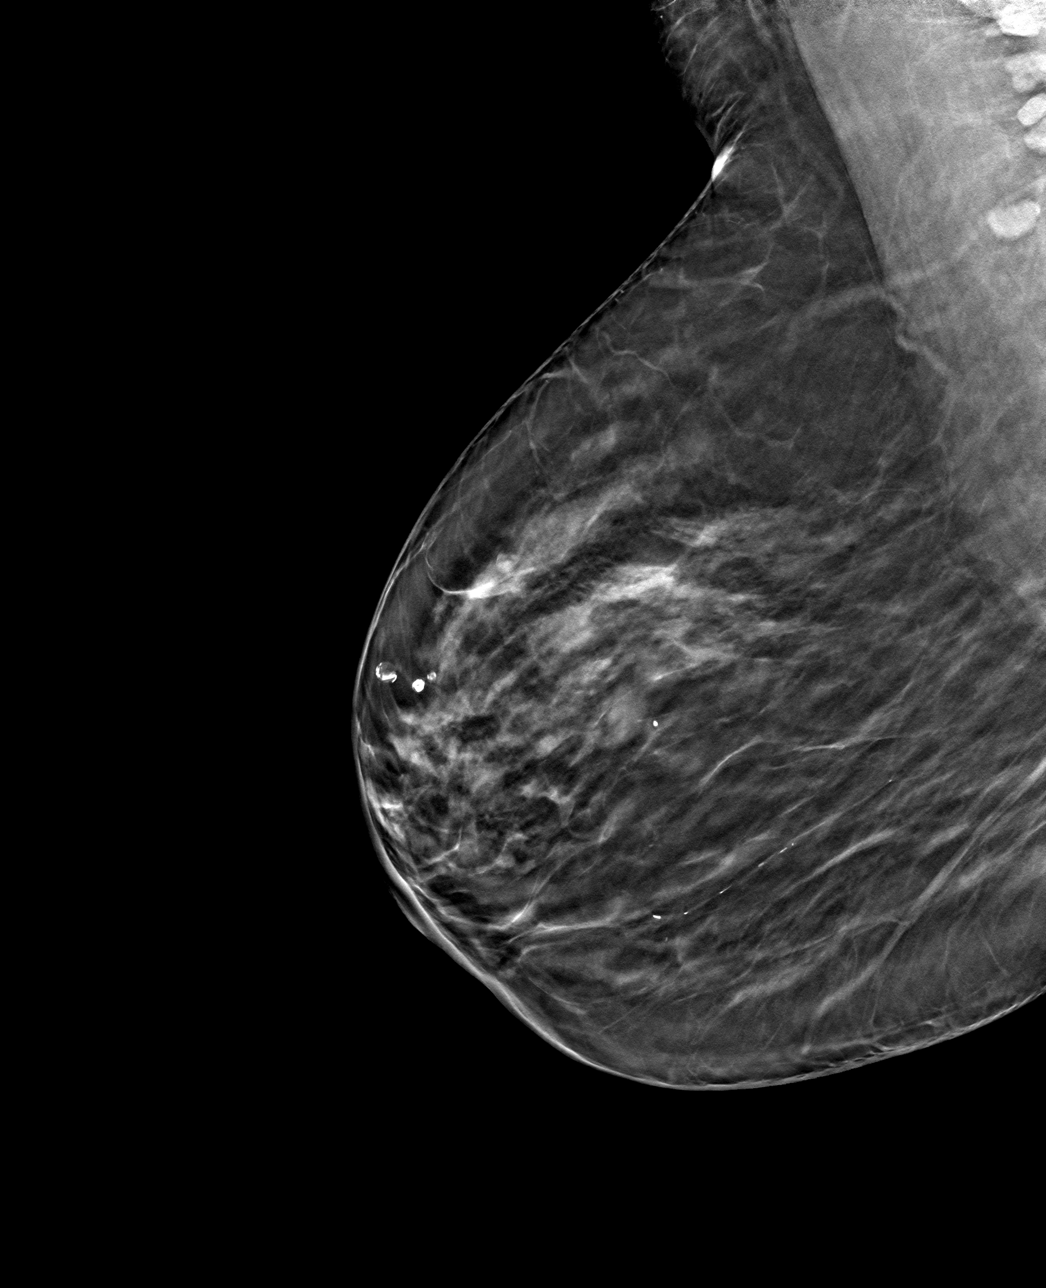

[4 of 12 positions shown; findings below may reference images not displayed]

ACR Breast Density Category b: There are scattered areas of
fibroglandular density.
FINDINGS: There are no findings suspicious for malignancy. Patient is status
post left mastectomy.
IMPRESSION: No mammographic evidence of malignancy. A result letter of this
screening mammogram will be mailed directly to the patient.

RECOMMENDATION:
Screening mammogram in one year. (Code:[id number]-[id number])

BI-RADS CATEGORY  1: Negative.

## 2023-04-11 ENCOUNTER — Other Ambulatory Visit: Payer: Self-pay | Admitting: Internal Medicine

## 2023-04-11 DIAGNOSIS — Z0000 Encounter for general adult medical examination without abnormal findings: Secondary | ICD-10-CM

## 2023-09-12 ENCOUNTER — Ambulatory Visit
Admission: RE | Admit: 2023-09-12 | Discharge: 2023-09-12 | Disposition: A | Discharge status: Home / Self Care | Payer: 59 | Source: Ambulatory Visit | Attending: Internal Medicine | Admitting: Internal Medicine

## 2023-09-12 DIAGNOSIS — Z0000 Encounter for general adult medical examination without abnormal findings: Secondary | ICD-10-CM

## 2024-01-12 ENCOUNTER — Other Ambulatory Visit (HOSPITAL_BASED_OUTPATIENT_CLINIC_OR_DEPARTMENT_OTHER): Payer: Self-pay

## 2024-01-12 ENCOUNTER — Other Ambulatory Visit (HOSPITAL_COMMUNITY): Payer: Self-pay

## 2024-01-12 MED ORDER — OZEMPIC (1 MG/DOSE) 4 MG/3ML ~~LOC~~ SOPN
1.0000 mg | PEN_INJECTOR | SUBCUTANEOUS | 2 refills | Status: AC
  Filled 2024-01-12 (×2): qty 3, 28d supply, fill #0
  Filled 2024-08-11 (×2): qty 9, 84d supply, fill #0
  Filled 2024-11-10: qty 9, 84d supply, fill #1

## 2024-01-20 ENCOUNTER — Other Ambulatory Visit: Payer: Self-pay | Admitting: Internal Medicine

## 2024-01-20 DIAGNOSIS — Z1231 Encounter for screening mammogram for malignant neoplasm of breast: Secondary | ICD-10-CM

## 2024-01-22 ENCOUNTER — Other Ambulatory Visit (HOSPITAL_COMMUNITY): Payer: Self-pay

## 2024-03-02 ENCOUNTER — Other Ambulatory Visit (HOSPITAL_COMMUNITY): Payer: Self-pay

## 2024-03-02 MED ORDER — OZEMPIC (1 MG/DOSE) 4 MG/3ML ~~LOC~~ SOPN
1.0000 mg | PEN_INJECTOR | SUBCUTANEOUS | 2 refills | Status: AC
  Filled 2024-03-02: qty 9, 84d supply, fill #0
  Filled 2024-05-17: qty 9, 84d supply, fill #1

## 2024-03-04 ENCOUNTER — Other Ambulatory Visit (HOSPITAL_COMMUNITY): Payer: Self-pay

## 2024-03-04 ENCOUNTER — Other Ambulatory Visit: Payer: Self-pay

## 2024-03-04 MED ORDER — METFORMIN HCL ER 500 MG PO TB24
1000.0000 mg | ORAL_TABLET | Freq: Every day | ORAL | 3 refills | Status: AC
  Filled 2024-03-04: qty 180, 90d supply, fill #0
  Filled 2024-06-07: qty 180, 90d supply, fill #1
  Filled 2024-09-23: qty 180, 90d supply, fill #2

## 2024-03-04 MED ORDER — ATORVASTATIN CALCIUM 40 MG PO TABS
40.0000 mg | ORAL_TABLET | Freq: Every day | ORAL | 2 refills | Status: DC
  Filled 2024-03-04: qty 90, 90d supply, fill #0
  Filled 2024-06-07: qty 90, 90d supply, fill #1
  Filled 2024-09-01: qty 90, 90d supply, fill #2

## 2024-03-04 MED ORDER — VITAMIN D (ERGOCALCIFEROL) 1.25 MG (50000 UNIT) PO CAPS
50000.0000 [IU] | ORAL_CAPSULE | ORAL | 2 refills | Status: AC
  Filled 2024-03-04: qty 6, 84d supply, fill #0
  Filled 2024-08-11: qty 6, 84d supply, fill #1

## 2024-03-04 MED ORDER — LOSARTAN POTASSIUM 50 MG PO TABS
50.0000 mg | ORAL_TABLET | Freq: Every day | ORAL | 3 refills | Status: AC
  Filled 2024-03-04: qty 90, 90d supply, fill #0
  Filled 2024-06-07: qty 90, 90d supply, fill #1
  Filled 2024-09-23 (×2): qty 90, 90d supply, fill #2

## 2024-03-08 ENCOUNTER — Other Ambulatory Visit: Payer: Self-pay | Admitting: Obstetrics and Gynecology

## 2024-03-08 DIAGNOSIS — Z1211 Encounter for screening for malignant neoplasm of colon: Secondary | ICD-10-CM | POA: Diagnosis not present

## 2024-03-08 DIAGNOSIS — Z1330 Encounter for screening examination for mental health and behavioral disorders, unspecified: Secondary | ICD-10-CM | POA: Diagnosis not present

## 2024-03-08 DIAGNOSIS — M8580 Other specified disorders of bone density and structure, unspecified site: Secondary | ICD-10-CM

## 2024-03-08 DIAGNOSIS — N898 Other specified noninflammatory disorders of vagina: Secondary | ICD-10-CM | POA: Diagnosis not present

## 2024-03-08 DIAGNOSIS — Z01419 Encounter for gynecological examination (general) (routine) without abnormal findings: Secondary | ICD-10-CM | POA: Diagnosis not present

## 2024-03-08 DIAGNOSIS — D069 Carcinoma in situ of cervix, unspecified: Secondary | ICD-10-CM | POA: Diagnosis not present

## 2024-03-08 DIAGNOSIS — C50919 Malignant neoplasm of unspecified site of unspecified female breast: Secondary | ICD-10-CM | POA: Diagnosis not present

## 2024-03-08 DIAGNOSIS — Z6831 Body mass index (BMI) 31.0-31.9, adult: Secondary | ICD-10-CM | POA: Diagnosis not present

## 2024-05-24 ENCOUNTER — Other Ambulatory Visit: Payer: Self-pay

## 2024-06-01 ENCOUNTER — Other Ambulatory Visit (HOSPITAL_COMMUNITY): Payer: Self-pay

## 2024-06-01 DIAGNOSIS — R0602 Shortness of breath: Secondary | ICD-10-CM | POA: Diagnosis not present

## 2024-06-01 DIAGNOSIS — R109 Unspecified abdominal pain: Secondary | ICD-10-CM | POA: Diagnosis not present

## 2024-06-01 DIAGNOSIS — Z1152 Encounter for screening for COVID-19: Secondary | ICD-10-CM | POA: Diagnosis not present

## 2024-06-01 DIAGNOSIS — R519 Headache, unspecified: Secondary | ICD-10-CM | POA: Diagnosis not present

## 2024-06-01 DIAGNOSIS — R0989 Other specified symptoms and signs involving the circulatory and respiratory systems: Secondary | ICD-10-CM | POA: Diagnosis not present

## 2024-06-01 DIAGNOSIS — R112 Nausea with vomiting, unspecified: Secondary | ICD-10-CM | POA: Diagnosis not present

## 2024-06-01 DIAGNOSIS — R059 Cough, unspecified: Secondary | ICD-10-CM | POA: Diagnosis not present

## 2024-06-01 DIAGNOSIS — R42 Dizziness and giddiness: Secondary | ICD-10-CM | POA: Diagnosis not present

## 2024-06-01 MED ORDER — MECLIZINE HCL 25 MG PO TABS
25.0000 mg | ORAL_TABLET | Freq: Two times a day (BID) | ORAL | 1 refills | Status: AC
  Filled 2024-06-01: qty 14, 7d supply, fill #0

## 2024-06-01 MED ORDER — ONDANSETRON 4 MG PO TBDP
4.0000 mg | ORAL_TABLET | Freq: Three times a day (TID) | ORAL | 0 refills | Status: AC
  Filled 2024-06-01: qty 21, 7d supply, fill #0

## 2024-06-03 DIAGNOSIS — H9313 Tinnitus, bilateral: Secondary | ICD-10-CM | POA: Diagnosis not present

## 2024-06-03 DIAGNOSIS — H9312 Tinnitus, left ear: Secondary | ICD-10-CM | POA: Diagnosis not present

## 2024-06-03 DIAGNOSIS — H903 Sensorineural hearing loss, bilateral: Secondary | ICD-10-CM | POA: Diagnosis not present

## 2024-06-03 DIAGNOSIS — R42 Dizziness and giddiness: Secondary | ICD-10-CM | POA: Diagnosis not present

## 2024-06-07 ENCOUNTER — Other Ambulatory Visit (HOSPITAL_COMMUNITY): Payer: Self-pay

## 2024-08-11 ENCOUNTER — Other Ambulatory Visit (HOSPITAL_COMMUNITY): Payer: Self-pay

## 2024-08-11 ENCOUNTER — Other Ambulatory Visit: Payer: Self-pay

## 2024-08-31 DIAGNOSIS — E1169 Type 2 diabetes mellitus with other specified complication: Secondary | ICD-10-CM | POA: Diagnosis not present

## 2024-08-31 DIAGNOSIS — E785 Hyperlipidemia, unspecified: Secondary | ICD-10-CM | POA: Diagnosis not present

## 2024-08-31 DIAGNOSIS — M8580 Other specified disorders of bone density and structure, unspecified site: Secondary | ICD-10-CM | POA: Diagnosis not present

## 2024-08-31 DIAGNOSIS — I10 Essential (primary) hypertension: Secondary | ICD-10-CM | POA: Diagnosis not present

## 2024-09-01 ENCOUNTER — Other Ambulatory Visit (HOSPITAL_COMMUNITY): Payer: Self-pay

## 2024-09-02 ENCOUNTER — Other Ambulatory Visit (HOSPITAL_COMMUNITY): Payer: Self-pay

## 2024-09-02 ENCOUNTER — Other Ambulatory Visit: Payer: Self-pay

## 2024-09-02 MED ORDER — ONETOUCH VERIO VI STRP
1.0000 | ORAL_STRIP | Freq: Every day | 3 refills | Status: AC
  Filled 2024-09-02: qty 100, 100d supply, fill #0

## 2024-09-02 MED ORDER — ONETOUCH DELICA PLUS LANCET33G MISC
1.0000 | Freq: Every day | 3 refills | Status: AC
  Filled 2024-09-02: qty 100, 100d supply, fill #0

## 2024-09-02 MED ORDER — ONETOUCH VERIO FLEX SYSTEM W/DEVICE KIT
1.0000 | PACK | Freq: Every day | 1 refills | Status: AC
  Filled 2024-09-02: qty 1, 100d supply, fill #0

## 2024-09-02 MED ORDER — GLUCOSE BLOOD VI STRP
ORAL_STRIP | 3 refills | Status: AC
  Filled 2024-09-02: qty 100, 100d supply, fill #0

## 2024-09-13 ENCOUNTER — Ambulatory Visit: Payer: 59

## 2024-09-13 ENCOUNTER — Ambulatory Visit
Admission: RE | Admit: 2024-09-13 | Discharge: 2024-09-13 | Disposition: A | Discharge status: Home / Self Care | Source: Ambulatory Visit | Attending: Internal Medicine | Admitting: Internal Medicine

## 2024-09-13 DIAGNOSIS — Z1231 Encounter for screening mammogram for malignant neoplasm of breast: Secondary | ICD-10-CM

## 2024-09-23 ENCOUNTER — Other Ambulatory Visit (HOSPITAL_COMMUNITY): Payer: Self-pay

## 2024-09-23 ENCOUNTER — Other Ambulatory Visit: Payer: Self-pay

## 2024-09-27 ENCOUNTER — Other Ambulatory Visit (HOSPITAL_COMMUNITY): Payer: Self-pay

## 2024-09-28 ENCOUNTER — Other Ambulatory Visit (HOSPITAL_COMMUNITY): Payer: Self-pay

## 2024-10-13 DIAGNOSIS — Z7689 Persons encountering health services in other specified circumstances: Secondary | ICD-10-CM | POA: Diagnosis not present

## 2024-10-26 DIAGNOSIS — M62838 Other muscle spasm: Secondary | ICD-10-CM | POA: Diagnosis not present

## 2024-10-26 DIAGNOSIS — S46812A Strain of other muscles, fascia and tendons at shoulder and upper arm level, left arm, initial encounter: Secondary | ICD-10-CM | POA: Diagnosis not present

## 2024-10-27 DIAGNOSIS — M50121 Cervical disc disorder at C4-C5 level with radiculopathy: Secondary | ICD-10-CM | POA: Diagnosis not present

## 2024-10-27 DIAGNOSIS — M5021 Other cervical disc displacement,  high cervical region: Secondary | ICD-10-CM | POA: Diagnosis not present

## 2024-10-29 DIAGNOSIS — M79602 Pain in left arm: Secondary | ICD-10-CM | POA: Diagnosis not present

## 2024-11-08 ENCOUNTER — Other Ambulatory Visit: Payer: Self-pay | Admitting: Internal Medicine

## 2024-11-08 DIAGNOSIS — M542 Cervicalgia: Secondary | ICD-10-CM

## 2024-11-09 ENCOUNTER — Inpatient Hospital Stay: Admission: RE | Admit: 2024-11-09 | Discharge: 2024-11-09 | Attending: Internal Medicine

## 2024-11-09 DIAGNOSIS — M50122 Cervical disc disorder at C5-C6 level with radiculopathy: Secondary | ICD-10-CM | POA: Diagnosis not present

## 2024-11-09 DIAGNOSIS — M5011 Cervical disc disorder with radiculopathy,  high cervical region: Secondary | ICD-10-CM | POA: Diagnosis not present

## 2024-11-09 DIAGNOSIS — M542 Cervicalgia: Secondary | ICD-10-CM

## 2024-11-09 DIAGNOSIS — M50123 Cervical disc disorder at C6-C7 level with radiculopathy: Secondary | ICD-10-CM | POA: Diagnosis not present

## 2024-11-09 DIAGNOSIS — M4802 Spinal stenosis, cervical region: Secondary | ICD-10-CM | POA: Diagnosis not present

## 2024-11-09 DIAGNOSIS — M50121 Cervical disc disorder at C4-C5 level with radiculopathy: Secondary | ICD-10-CM | POA: Diagnosis not present

## 2024-11-11 ENCOUNTER — Other Ambulatory Visit (HOSPITAL_COMMUNITY): Payer: Self-pay

## 2024-11-11 DIAGNOSIS — Z6829 Body mass index (BMI) 29.0-29.9, adult: Secondary | ICD-10-CM | POA: Diagnosis not present

## 2024-11-11 DIAGNOSIS — M5412 Radiculopathy, cervical region: Secondary | ICD-10-CM | POA: Diagnosis not present

## 2024-11-12 ENCOUNTER — Other Ambulatory Visit

## 2024-11-16 ENCOUNTER — Inpatient Hospital Stay (HOSPITAL_BASED_OUTPATIENT_CLINIC_OR_DEPARTMENT_OTHER)
Admission: RE | Admit: 2024-11-16 | Discharge: 2024-11-16 | Attending: Obstetrics and Gynecology | Admitting: Obstetrics and Gynecology

## 2024-11-16 DIAGNOSIS — M8589 Other specified disorders of bone density and structure, multiple sites: Secondary | ICD-10-CM | POA: Insufficient documentation

## 2024-11-16 DIAGNOSIS — M8580 Other specified disorders of bone density and structure, unspecified site: Secondary | ICD-10-CM | POA: Diagnosis present

## 2024-11-17 ENCOUNTER — Other Ambulatory Visit (HOSPITAL_COMMUNITY): Payer: Self-pay

## 2024-11-17 MED ORDER — LIDOCAINE 5 % EX PTCH
MEDICATED_PATCH | CUTANEOUS | 0 refills | Status: DC
  Filled 2024-11-17 – 2024-11-22 (×2): qty 10, 10d supply, fill #0

## 2024-11-18 ENCOUNTER — Other Ambulatory Visit

## 2024-11-22 ENCOUNTER — Other Ambulatory Visit (HOSPITAL_COMMUNITY): Payer: Self-pay

## 2024-12-10 ENCOUNTER — Other Ambulatory Visit: Payer: Self-pay

## 2024-12-10 ENCOUNTER — Other Ambulatory Visit (HOSPITAL_COMMUNITY): Payer: Self-pay

## 2024-12-10 MED ORDER — ATORVASTATIN CALCIUM 40 MG PO TABS
40.0000 mg | ORAL_TABLET | Freq: Every day | ORAL | 1 refills | Status: AC
  Filled 2024-12-10: qty 90, 90d supply, fill #0

## 2024-12-27 ENCOUNTER — Other Ambulatory Visit (HOSPITAL_COMMUNITY): Payer: Self-pay

## 2024-12-28 ENCOUNTER — Other Ambulatory Visit (HOSPITAL_COMMUNITY): Payer: Self-pay

## 2024-12-28 MED ORDER — METFORMIN HCL ER 500 MG PO TB24
1000.0000 mg | ORAL_TABLET | Freq: Every evening | ORAL | 3 refills | Status: AC
  Filled 2024-12-28: qty 180, 90d supply, fill #0

## 2024-12-28 MED ORDER — LOSARTAN POTASSIUM 50 MG PO TABS
50.0000 mg | ORAL_TABLET | Freq: Every day | ORAL | 3 refills | Status: AC
  Filled 2024-12-28: qty 90, 90d supply, fill #0
